# Patient Record
Sex: Male | Born: 1935 | Race: White | Hispanic: No | Marital: Married | Smoking: Never smoker
Health system: Southern US, Community
[De-identification: ages and names within clinical notes are randomized; demographics above are authoritative.]

## PROBLEM LIST (undated history)

## (undated) DIAGNOSIS — K602 Anal fissure, unspecified: Secondary | ICD-10-CM

## (undated) DIAGNOSIS — D126 Benign neoplasm of colon, unspecified: Secondary | ICD-10-CM

## (undated) DIAGNOSIS — N189 Chronic kidney disease, unspecified: Secondary | ICD-10-CM

## (undated) DIAGNOSIS — E785 Hyperlipidemia, unspecified: Secondary | ICD-10-CM

## (undated) DIAGNOSIS — K5790 Diverticulosis of intestine, part unspecified, without perforation or abscess without bleeding: Secondary | ICD-10-CM

## (undated) DIAGNOSIS — I10 Essential (primary) hypertension: Secondary | ICD-10-CM

## (undated) HISTORY — DX: Benign neoplasm of colon, unspecified: D12.6

## (undated) HISTORY — PX: ANAL FISSURE REPAIR: SHX2312

## (undated) HISTORY — DX: Essential (primary) hypertension: I10

## (undated) HISTORY — DX: Hyperlipidemia, unspecified: E78.5

## (undated) HISTORY — DX: Anal fissure, unspecified: K60.2

## (undated) HISTORY — PX: TONSILLECTOMY: SUR1361

## (undated) HISTORY — DX: Diverticulosis of intestine, part unspecified, without perforation or abscess without bleeding: K57.90

## (undated) HISTORY — PX: INGUINAL HERNIA REPAIR: SUR1180

## (undated) HISTORY — DX: Chronic kidney disease, unspecified: N18.9

---

## 2005-03-27 HISTORY — PX: TRANSTHORACIC ECHOCARDIOGRAM: SHX275

## 2006-05-18 ENCOUNTER — Ambulatory Visit (HOSPITAL_COMMUNITY): Admission: RE | Admit: 2006-05-18 | Discharge: 2006-05-18 | Payer: Self-pay | Admitting: General Surgery

## 2009-02-23 HISTORY — PX: COLONOSCOPY: SHX174

## 2010-09-14 ENCOUNTER — Encounter: Payer: Self-pay | Admitting: Cardiovascular Disease

## 2010-09-16 ENCOUNTER — Encounter: Payer: Self-pay | Admitting: Cardiovascular Disease

## 2010-09-16 ENCOUNTER — Ambulatory Visit (INDEPENDENT_AMBULATORY_CARE_PROVIDER_SITE_OTHER): Payer: Medicare Other | Admitting: Cardiovascular Disease

## 2010-09-16 DIAGNOSIS — R079 Chest pain, unspecified: Secondary | ICD-10-CM | POA: Insufficient documentation

## 2010-09-16 DIAGNOSIS — E785 Hyperlipidemia, unspecified: Secondary | ICD-10-CM | POA: Insufficient documentation

## 2010-09-16 NOTE — Progress Notes (Signed)
Justin Shaw Date of Birth  Dec 23, 1934 [facility] [address]     [address] [place], [place]  [address] [phone]  Fax  [phone]  History of Present Illness:  76 yo gentleman with onset of CP.  No related to any activity.  Works out every day without any problems.no syncope, no dyspnea.  The episode lasted for a few minutes. Was recurrent for 2-3 weeks.  His pains have resolved and now he is not having any pain in several weeks. He saw the nurse practitioner at [facility] and was sent up here for a second opinion.   Current Outpatient Prescriptions  Medication Sig Dispense Refill  . aspirin 81 MG tablet Take 81 mg by mouth daily.        . Glucosamine 500 MG TABS Take by mouth.        . Multiple Vitamin (MULTIVITAMIN) capsule Take 1 capsule by mouth daily.        . rosuvastatin (CRESTOR) 5 MG tablet Take 5 mg by mouth daily.           Allergies  Allergen Reactions  . Hydrocodone     Past Medical History  Diagnosis Date  . Chest pain   . Hypertension   . Dyslipidemia     Past Surgical History  Procedure Date  . Colonoscopy 02/23/2009  . Transthoracic echocardiogram 2007    NORMAL LV FUNCTION, AND MILD TRACE MITRAL AND TRICUSPID REGURGITATION  . Inguinal hernia repair     History  Smoking status  . Never Smoker   Smokeless tobacco  . Never Used    History  Alcohol Use No    Family History  Problem Relation Age of Onset  . Hypertension Mother     Reviw of Systems:  Reviewed in the HPI.  All other systems are negative.  Physical Exam: BP 140/82  Pulse 80  Wt 161 lb (73.029 kg) The patient is alert and oriented x 3.  The mood and affect are normal.   Skin: warm and dry.  Color is normal.    HEENT:   the sclera are nonicteric.  The mucous membranes are moist.  The carotids are 2+ without bruits.  There is no thyromegaly.  There is no JVD.    Lungs: clear.  The chest wall is non  tender.    Heart: regular rate with a normal S1 and S2.  There Is a soft 1-2/6 systolic murmur.. The PMI is not displaced.     Abdomin: good bowel sounds.  There is no guarding or rebound.  There is no hepatosplenomegaly or tenderness.  There are no masses.   Extremities:  no clubbing, cyanosis, or edema.  The legs are without rashes.  The distal pulses are intact.   Neuro:  Cranial nerves II - XII are intact.  Motor and sensory functions are intact.    The gait is normal.  ECG: From [facility] reveals normal sinus rhythm. He has no ST or T wave changes.  Assessment / Plan:

## 2010-09-16 NOTE — Assessment & Plan Note (Signed)
Justin Shaw chest pain sounds atypical. I suspect that it may be musculoskeletal pain. He has been exercising for the past several weeks without any episodes of chest pain. At this point I think that his pretest probability is fairly low. He has some hypercholesterolemia but has been well controlled. He does not smoke and he does not have any family history.  We discussed doing a stress echocardiogram but he would like to  wait and see if the pains occurs again. I would tend to agree that these pains were probably atypical and we can continue to follow him clinically. I've given him my business card and he will call me if he has any recurrent episodes of chest pain.I'll see him again in 3 months.

## 2010-12-14 ENCOUNTER — Encounter: Payer: Self-pay | Admitting: Gastroenterology

## 2010-12-22 ENCOUNTER — Ambulatory Visit (INDEPENDENT_AMBULATORY_CARE_PROVIDER_SITE_OTHER): Payer: Medicare Other | Admitting: Cardiovascular Disease

## 2010-12-22 ENCOUNTER — Encounter: Payer: Self-pay | Admitting: Cardiovascular Disease

## 2010-12-22 DIAGNOSIS — R079 Chest pain, unspecified: Secondary | ICD-10-CM

## 2010-12-22 NOTE — Progress Notes (Signed)
Justin Shaw Date of Birth  12-15-1934 [facility] [address]    [address] [place], [place]  [address] [phone]  Fax  [phone]  History of Present Illness:  76 year old gentleman with a history of chest pain. I saw him back in June.  His chest pain symptoms have completely resolved. We did not do a stress test because of his overall low pretest probability. He continues to exercise at [place], regular basis and has not had any episodes of chest pain.  He has noticed some leg fatigue and weakness.  He thinks that it might have been due to the Crestor. He stop the Crestor and is leg weakness and pain has improved.  Current Outpatient Prescriptions on File Prior to Visit  Medication Sig Dispense Refill  . aspirin 81 MG tablet Take 81 mg by mouth daily.        . Glucosamine 500 MG TABS Take by mouth.        . Multiple Vitamin (MULTIVITAMIN) capsule Take 1 capsule by mouth daily.          Allergies  Allergen Reactions  . Hydrocodone     Past Medical History  Diagnosis Date  . Chest pain   . Hypertension   . Dyslipidemia     Past Surgical History  Procedure Date  . Colonoscopy 02/23/2009  . Transthoracic echocardiogram 2007    NORMAL LV FUNCTION, AND MILD TRACE MITRAL AND TRICUSPID REGURGITATION  . Inguinal hernia repair     History  Smoking status  . Never Smoker   Smokeless tobacco  . Never Used    History  Alcohol Use No    Family History  Problem Relation Age of Onset  . Hypertension Mother     Reviw of Systems:  Reviewed in the HPI.  All other systems are negative.  Physical Exam: BP 150/104  Pulse 86  Ht 5\' 10"  (1.778 m)  Wt 161 lb 3.2 oz (73.12 kg)  BMI 23.13 kg/m2 The patient is alert and oriented x 3.  The mood and affect are normal.   Skin: warm and dry.  Color is normal.    HEENT:   the sclera are nonicteric.  The mucous membranes are moist.  The carotids are 2+ without bruits.  There is no thyromegaly.  There is no JVD.     Lungs: clear.  The chest wall is non tender.    Heart: regular rate with a normal S1 and S2.  There are no murmurs, gallops, or rubs. The PMI is not displaced.     Abdomen: good bowel sounds.  There is no guarding or rebound.  There is no hepatosplenomegaly or tenderness.  There are no masses.   Extremities:  no clubbing, cyanosis, or edema.  The legs are without rashes.  The distal pulses are intact.   Neuro:  Cranial nerves II - XII are intact.  Motor and sensory functions are intact.    The gait is normal.  ECG:  Assessment / Plan:

## 2010-12-22 NOTE — Assessment & Plan Note (Signed)
His chest pains have all resolved. He has stopped taking Crestor and he overall feels quite a bit better.  I'll see him again on as-needed basis.

## 2010-12-22 NOTE — Patient Instructions (Signed)
Continue with a good workout program. The back and see [place] if they have any problems.

## 2010-12-28 ENCOUNTER — Encounter: Payer: Self-pay | Admitting: Gastroenterology

## 2011-01-17 ENCOUNTER — Ambulatory Visit (AMBULATORY_SURGERY_CENTER): Payer: Medicare Other

## 2011-01-17 ENCOUNTER — Encounter: Payer: Self-pay | Admitting: Gastroenterology

## 2011-01-17 VITALS — Ht 70.0 in | Wt 164.4 lb

## 2011-01-17 DIAGNOSIS — Z1211 Encounter for screening for malignant neoplasm of colon: Secondary | ICD-10-CM

## 2011-01-17 MED ORDER — PEG-KCL-NACL-NASULF-NA ASC-C 100 G PO SOLR: 1.0000 | Freq: Once | ORAL | Status: AC

## 2011-01-25 ENCOUNTER — Ambulatory Visit (AMBULATORY_SURGERY_CENTER): Payer: Medicare Other | Admitting: Gastroenterology

## 2011-01-25 ENCOUNTER — Encounter: Payer: Self-pay | Admitting: Gastroenterology

## 2011-01-25 VITALS — BP 148/91 | HR 78 | Temp 97.3°F | Resp 18 | Ht 70.0 in | Wt 164.0 lb

## 2011-01-25 DIAGNOSIS — D126 Benign neoplasm of colon, unspecified: Secondary | ICD-10-CM

## 2011-01-25 DIAGNOSIS — K5730 Diverticulosis of large intestine without perforation or abscess without bleeding: Secondary | ICD-10-CM | POA: Insufficient documentation

## 2011-01-25 DIAGNOSIS — Z1211 Encounter for screening for malignant neoplasm of colon: Secondary | ICD-10-CM

## 2011-01-25 MED ORDER — SODIUM CHLORIDE 0.9 % IV SOLN: 500.0000 mL | INTRAVENOUS | Status: DC

## 2011-01-25 NOTE — Patient Instructions (Signed)
Please refer to the blue and neon green sheets for instructions regarding diet and activity for the rest of today.  Handouts on polyps and diverticulosis given. Resume previous medications. 

## 2011-01-26 ENCOUNTER — Telehealth: Payer: Self-pay

## 2011-01-26 NOTE — Telephone Encounter (Signed)

## 2011-01-31 ENCOUNTER — Encounter: Payer: Self-pay | Admitting: Gastroenterology

## 2015-01-15 ENCOUNTER — Encounter: Payer: Self-pay | Admitting: Gastroenterology

## 2015-01-25 ENCOUNTER — Telehealth: Payer: Self-pay | Admitting: Emergency Medicine

## 2015-01-25 NOTE — Telephone Encounter (Deleted)
Call from wife requested prednisone additional prescription since patient started 10 mg po over regular dose on Saturday;

## 2015-11-03 ENCOUNTER — Encounter: Payer: Self-pay | Admitting: *Deleted

## 2015-11-15 ENCOUNTER — Encounter: Payer: Self-pay | Admitting: Gastroenterology

## 2015-12-16 ENCOUNTER — Encounter: Payer: Self-pay | Admitting: Podiatry

## 2015-12-16 ENCOUNTER — Ambulatory Visit (INDEPENDENT_AMBULATORY_CARE_PROVIDER_SITE_OTHER): Payer: Medicare Other | Admitting: Podiatry

## 2015-12-16 VITALS — Ht 70.0 in | Wt 158.0 lb

## 2015-12-16 DIAGNOSIS — B351 Tinea unguium: Secondary | ICD-10-CM

## 2015-12-16 DIAGNOSIS — M79676 Pain in unspecified toe(s): Secondary | ICD-10-CM

## 2015-12-16 DIAGNOSIS — L609 Nail disorder, unspecified: Secondary | ICD-10-CM | POA: Diagnosis not present

## 2015-12-16 DIAGNOSIS — L608 Other nail disorders: Secondary | ICD-10-CM

## 2015-12-16 DIAGNOSIS — M79609 Pain in unspecified limb: Principal | ICD-10-CM

## 2015-12-16 DIAGNOSIS — L603 Nail dystrophy: Secondary | ICD-10-CM

## 2015-12-16 NOTE — Progress Notes (Signed)
   Subjective:    Patient ID: Justin Shaw, male    DOB: [birth year], 81 y.o.   MRN: [id number]  HPI    Review of Systems  All other systems reviewed and are negative.      Objective:   Physical Exam        Assessment & Plan:

## 2015-12-19 NOTE — Progress Notes (Signed)
Patient ID: Justin Shaw, male   DOB: Feb 05, 1935, 81 y.o.   MRN: [id number] SUBJECTIVE Patient  presents to office today complaining of elongated, thickened nails. Pain while ambulating in shoes. Patient is unable to trim their own nails.   OBJECTIVE General Patient is awake, alert, and oriented x 3 and in no acute distress. Derm Skin is dry and supple bilateral. Negative open lesions or macerations. Remaining integument unremarkable. Nails are tender, long, thickened and dystrophic with subungual debris, consistent with onychomycosis, bilateral great toes. No signs of infection noted. Vasc  DP and PT pedal pulses palpable bilaterally. Temperature gradient within normal limits.  Neuro Epicritic and protective threshold sensation diminished bilaterally.  Musculoskeletal Exam No symptomatic pedal deformities noted bilateral. Muscular strength within normal limits.  ASSESSMENT 1. Onychodystrophic nails bilateral great toes with hyperkeratosis of nails.  2. Onychomycosis of nail due to dermatophyte bilateral 3. Pain in foot bilateral  PLAN OF CARE 1. Patient evaluated today.  2. Instructed to maintain good pedal hygiene and foot care.  3. Mechanical debridement of bilateral great toenails performed using a nail nipper. Filed with dremel without incident.  4. Return to clinic when necessary   Edrick Kins, DPM

## 2016-08-03 ENCOUNTER — Other Ambulatory Visit: Payer: Self-pay | Admitting: Internal Medicine

## 2016-08-03 DIAGNOSIS — N183 Chronic kidney disease, stage 3 unspecified: Secondary | ICD-10-CM

## 2016-08-09 ENCOUNTER — Ambulatory Visit
Admission: RE | Admit: 2016-08-09 | Discharge: 2016-08-09 | Disposition: A | Discharge status: Home / Self Care | Payer: Medicare Other | Source: Ambulatory Visit | Attending: Internal Medicine | Admitting: Internal Medicine

## 2016-08-09 DIAGNOSIS — N183 Chronic kidney disease, stage 3 unspecified: Secondary | ICD-10-CM

## 2016-09-08 ENCOUNTER — Encounter (INDEPENDENT_AMBULATORY_CARE_PROVIDER_SITE_OTHER): Payer: Self-pay

## 2016-09-08 ENCOUNTER — Encounter: Payer: Self-pay | Admitting: Gastroenterology

## 2016-09-08 ENCOUNTER — Ambulatory Visit (INDEPENDENT_AMBULATORY_CARE_PROVIDER_SITE_OTHER): Payer: Medicare Other | Admitting: Gastroenterology

## 2016-09-08 VITALS — BP 134/84 | HR 76 | Ht 68.11 in | Wt 156.1 lb

## 2016-09-08 DIAGNOSIS — Z86010 Personal history of colonic polyps: Secondary | ICD-10-CM | POA: Diagnosis not present

## 2016-09-08 DIAGNOSIS — R54 Age-related physical debility: Secondary | ICD-10-CM | POA: Diagnosis not present

## 2016-09-08 MED ORDER — NA SULFATE-K SULFATE-MG SULF 17.5-3.13-1.6 GM/177ML PO SOLN: 1.0000 | Freq: Once | ORAL | 0 refills | Status: AC

## 2016-09-08 NOTE — Progress Notes (Signed)
HPI :  82 y/o male with a history of HTN, HLD, diverticulosis, here to discuss having a colonoscopy given his history of polyps and age.  Last colonoscopy in 2012 with one diminutive adenoma removed. No bowel changes. No blood in the stools. He thinks he has lost some weight, 10 lbs over the past year or so. Eating well. He is very active. He works 7 days per week, he's a Software engineer.   He has no cardiopulmonary issues. He does 30 pushups frequently and walks for exercise. No significant changes to his medical history in recent years.     Colonoscopy 01/25/2011 - diverticulosis, diminutive sigmoid polyp - adenoma, rectal nodule, hypertrophied anal papillae Colonoscopy 10/17/2000 - diverticulosis, hypertrophied anal papillae, hemorrhoids   Past Medical History:  Diagnosis Date  . Anal fissure   . Chest pain   . Diverticulosis   . Dyslipidemia   . Hypertension      Past Surgical History:  Procedure Laterality Date  . ANAL FISSURE REPAIR    . COLONOSCOPY  02/23/2009  . INGUINAL HERNIA REPAIR    . TONSILLECTOMY    . TRANSTHORACIC ECHOCARDIOGRAM  2007   NORMAL LV FUNCTION, AND MILD TRACE MITRAL AND TRICUSPID REGURGITATION   Family History  Problem Relation Age of Onset  . Hypertension Mother   . Diabetes Mother   . Stroke Mother   . Colon cancer Neg Hx    Social History  Substance Use Topics  . Smoking status: Never Smoker  . Smokeless tobacco: Never Used  . Alcohol use Yes     Comment: occasional wine   Current Outpatient Prescriptions  Medication Sig Dispense Refill  . aspirin 81 MG tablet Take 81 mg by mouth daily.      . B Complex Vitamins (VITAMIN B COMPLEX PO) Take 1 tablet by mouth daily.     . benazepril (LOTENSIN) 10 MG tablet Take 10 mg by mouth daily.     . Cholecalciferol (VITAMIN D3) 5000 units CAPS Take 1 capsule by mouth daily.    . Coenzyme Q10 (COQ10) 200 MG CAPS Take 1 tablet by mouth daily.     . fluticasone (FLONASE) 50 MCG/ACT nasal spray Place 1  spray into both nostrils daily.    . Glucosamine 500 MG TABS Take 1 tablet by mouth daily.     Marland Kitchen ibandronate (BONIVA) 150 MG tablet Take 150 mg by mouth every 30 (thirty) days. Take in the morning with a full glass of water, on an empty stomach, and do not take anything else by mouth or lie down for the next 30 min.    Marland Kitchen ibuprofen (ADVIL,MOTRIN) 400 MG tablet Take 400 mg by mouth every 6 (six) hours as needed.      Marland Kitchen LIVALO 4 MG TABS Take 1 tablet by mouth daily.      No current facility-administered medications for this visit.    Allergies  Allergen Reactions  . Crestor [Rosuvastatin Calcium] Other (See Comments)    Chest pain and leg pain  . Hydrocodone Nausea And Vomiting  . Oxycodone-Acetaminophen Nausea And Vomiting     Review of Systems: All systems reviewed and negative except where noted in HPI.   No results found for: WBC, HGB, HCT, MCV, PLT  No results found for: CREATININE, BUN, NA, K, CL, CO2    Physical Exam: BP 134/84 (BP Location: Left Arm, Patient Position: Sitting, Cuff Size: Normal)   Pulse 76   Ht 5' 8.11" (1.73 m) Comment: height measured without  shoes  Wt 156 lb 2 oz (70.8 kg)   BMI 23.66 kg/m  Constitutional: Pleasant,well-developed, male in no acute distress. HEENT: Normocephalic and atraumatic. Conjunctivae are normal. No scleral icterus. Neck supple.  Cardiovascular: Normal rate, regular rhythm.  Pulmonary/chest: Effort normal and breath sounds normal. No wheezing, rales or rhonchi. Abdominal: Soft, nondistended, nontender. there are no masses palpable. No hepatomegaly. Extremities: no edema Lymphadenopathy: No cervical adenopathy noted. Neurological: Alert and oriented to person place and time. Skin: Skin is warm and dry. No rashes noted. Psychiatric: Normal mood and affect. Behavior is normal.   ASSESSMENT AND PLAN: 82 year old male, relatively healthy as outlined above, here to discuss potential surveillance colonoscopy given his history of  one small adenoma that was removed 5 years ago and his age. He is due for surveillance colonoscopy should he wish to have further surveillance. I discussed the risks and benefits of colonoscopy and anesthesia with him. He is in good health for his age and think benefits of colonoscopy would outweigh risks at this point. We discussed this for a bit and at which age to stop surveillance colonoscopy. Following discussion of this he wished to proceed with one more colonoscopy, further recommendations pending the result.  Albion Cellar, MD [facility] Pager [phone]  CC: Crist Infante, MD

## 2016-09-08 NOTE — Patient Instructions (Signed)
If you are age 80 or older, your body mass index should be between 23-30. Your Body mass index is 23.66 kg/m. If this is out of the aforementioned range listed, please consider follow up with your Primary Care Provider.  If you are age 69 or younger, your body mass index should be between 19-25. Your Body mass index is 23.66 kg/m. If this is out of the aformentioned range listed, please consider follow up with your Primary Care Provider.   We have sent the following medications to your pharmacy for you to pick up at your convenience:  [place] have been scheduled for a colonoscopy. Please follow written instructions given to you at your visit today.  Please pick up your prep supplies at the pharmacy within the next 1-3 days. If you use inhalers (even only as needed), please bring them with you on the day of your procedure. Your physician has requested that you go to www.startemmi.com and enter the access code given to you at your visit today. This web site gives a general overview about your procedure. However, you should still follow specific instructions given to you by our office regarding your preparation for the procedure.  Thank you.

## 2016-09-11 ENCOUNTER — Encounter: Payer: Self-pay | Admitting: Gastroenterology

## 2016-09-22 ENCOUNTER — Encounter: Payer: Self-pay | Admitting: Gastroenterology

## 2016-09-22 ENCOUNTER — Ambulatory Visit (AMBULATORY_SURGERY_CENTER): Payer: Medicare Other | Admitting: Gastroenterology

## 2016-09-22 VITALS — BP 143/79 | HR 86 | Temp 99.1°F | Resp 12 | Ht 68.0 in | Wt 156.0 lb

## 2016-09-22 DIAGNOSIS — D120 Benign neoplasm of cecum: Secondary | ICD-10-CM

## 2016-09-22 DIAGNOSIS — Z86010 Personal history of colonic polyps: Secondary | ICD-10-CM | POA: Diagnosis not present

## 2016-09-22 DIAGNOSIS — D122 Benign neoplasm of ascending colon: Secondary | ICD-10-CM

## 2016-09-22 DIAGNOSIS — D125 Benign neoplasm of sigmoid colon: Secondary | ICD-10-CM | POA: Diagnosis not present

## 2016-09-22 DIAGNOSIS — K621 Rectal polyp: Secondary | ICD-10-CM | POA: Diagnosis not present

## 2016-09-22 DIAGNOSIS — D129 Benign neoplasm of anus and anal canal: Secondary | ICD-10-CM

## 2016-09-22 MED ORDER — SODIUM CHLORIDE 0.9 % IV SOLN: 500.0000 mL | INTRAVENOUS | Status: DC

## 2016-09-22 NOTE — Patient Instructions (Signed)
Handout given on polyps  YOU HAD AN ENDOSCOPIC PROCEDURE TODAY: Refer to the procedure report and other information in the discharge instructions given to you for any specific questions about what was found during the examination. If this information does not answer your questions, please call [place] office at [phone] to clarify.   YOU SHOULD EXPECT: Some feelings of bloating in the abdomen. Passage of more gas than usual. Walking can help get rid of the air that was put into your GI tract during the procedure and reduce the bloating. If you had a lower endoscopy (such as a colonoscopy or flexible sigmoidoscopy) you may notice spotting of blood in your stool or on the toilet paper. Some abdominal soreness may be present for a day or two, also.  DIET: Your first meal following the procedure should be a light meal and then it is ok to progress to your normal diet. A half-sandwich or bowl of soup is an example of a good first meal. Heavy or fried foods are harder to digest and may make you feel nauseous or bloated. Drink plenty of fluids but you should avoid alcoholic beverages for 24 hours. If you had a esophageal dilation, please see attached instructions for diet.    ACTIVITY: Your care partner should take you home directly after the procedure. You should plan to take it easy, moving slowly for the rest of the day. You can resume normal activity the day after the procedure however YOU SHOULD NOT DRIVE, use power tools, machinery or perform tasks that involve climbing or major physical exertion for 24 hours (because of the sedation medicines used during the test).   SYMPTOMS TO REPORT IMMEDIATELY: A gastroenterologist can be reached at any hour. Please call [phone]  for any of the following symptoms:  Following lower endoscopy (colonoscopy, flexible sigmoidoscopy) Excessive amounts of blood in the stool  Significant tenderness, worsening of abdominal pains  Swelling of the abdomen that is  new, acute  Fever of 100 or higher    FOLLOW UP:  If any biopsies were taken you will be contacted by phone or by letter within the next 1-3 weeks. Call [phone]  if you have not heard about the biopsies in 3 weeks.  Please also call with any specific questions about appointments or follow up tests.  

## 2016-09-22 NOTE — Progress Notes (Signed)
Called to room to assist during endoscopic procedure.  Patient ID and intended procedure confirmed with present staff. Received instructions for my participation in the procedure from the performing physician.  

## 2016-09-22 NOTE — Progress Notes (Signed)
Pt's states no medical or surgical changes since previsit or office visit. 

## 2016-09-22 NOTE — Op Note (Signed)
[place] Patient Name: Justin Shaw Procedure Date: 09/22/2016 3:34 PM MRN: [id number] Endoscopist: Remo Lipps P. Armbruster MD, MD Age: 82 Referring MD:  Date of Birth: Jul 02, 1934 Gender: Male Account #: [id number] Procedure:                Colonoscopy Indications:              Surveillance: Personal history of adenomatous                            polyps on last colonoscopy 5 years ago Medicines:                Monitored Anesthesia Care Procedure:                Pre-Anesthesia Assessment:                           - Prior to the procedure, a History and Physical                            was performed, and patient medications and                            allergies were reviewed. The patient's tolerance of                            previous anesthesia was also reviewed. The risks                            and benefits of the procedure and the sedation                            options and risks were discussed with the patient.                            All questions were answered, and informed consent                            was obtained. Prior Anticoagulants: The patient has                            taken no previous anticoagulant or antiplatelet                            agents. ASA Grade Assessment: II - A patient with                            mild systemic disease. After reviewing the risks                            and benefits, the patient was deemed in                            satisfactory condition to undergo the procedure.  After obtaining informed consent, the colonoscope                            was passed under direct vision. Throughout the                            procedure, the patient's blood pressure, pulse, and                            oxygen saturations were monitored continuously. The                            Colonoscope was introduced through the anus and                            advanced to the the  cecum, identified by                            appendiceal orifice and ileocecal valve. The                            colonoscopy was performed without difficulty. The                            patient tolerated the procedure well. The quality                            of the bowel preparation was good. The ileocecal                            valve, appendiceal orifice, and rectum were                            photographed. Scope In: 3:42:56 PM Scope Out: 4:03:17 PM Total Procedure Duration: 0 hours 20 minutes 21 seconds  Findings:                 The perianal and digital rectal examinations were                            normal.                           A 5 mm polyp was found in the cecum. The polyp was                            flat. The polyp was removed with a cold snare.                            Resection and retrieval were complete.                           A 3 mm polyp was found in the cecum. The polyp was  sessile. The polyp was removed with a cold snare.                            Resection and retrieval were complete.                           A 4 mm polyp was found in the ascending colon. The                            polyp was sessile. The polyp was removed with a                            cold snare. Resection and retrieval were complete.                           A 5 mm polyp was found in the sigmoid colon. The                            polyp was sessile. The polyp was removed with a                            cold snare. Resection and retrieval were complete.                           Scattered medium-mouthed diverticula were found in                            the left colon and right colon.                           A benign appearing polypoid lesion was found in the                            distal rectum. The lesion was semi-pedunculated.                            Biopsies were taken with a cold forceps for                             histology.                           Internal hemorrhoids were found during retroflexion.                           The exam was otherwise without abnormality. Complications:            No immediate complications. Estimated blood loss:                            Minimal. Estimated Blood Loss:     Estimated blood loss was minimal. Impression:               - One 5 mm polyp in the cecum, removed with a  cold                            snare. Resected and retrieved.                           - One 3 mm polyp in the cecum, removed with a cold                            snare. Resected and retrieved.                           - One 4 mm polyp in the ascending colon, removed                            with a cold snare. Resected and retrieved.                           - One 5 mm polyp in the sigmoid colon, removed with                            a cold snare. Resected and retrieved.                           - Diverticulosis in the left colon and in the right                            colon.                           - Benign appearing polypoid lesion in the distal                            rectum. Biopsied.                           - Internal hemorrhoids.                           - The examination was otherwise normal. Recommendation:           - Patient has a contact number available for                            emergencies. The signs and symptoms of potential                            delayed complications were discussed with the                            patient. Return to normal activities tomorrow.                            Written discharge instructions were provided to the  patient.                           - Resume previous diet.                           - Continue present medications.                           - Await pathology results.                           - Repeat colonoscopy is recommended for                             surveillance. The colonoscopy date will be                            determined after pathology results from today's                            exam become available for review.                           - No ibuprofen, naproxen, or other non-steroidal                            anti-inflammatory drugs for 2 weeks after polyp                            removal. Remo Lipps P. Armbruster MD, MD 09/22/2016 4:09:14 PM This report has been signed electronically.

## 2016-09-22 NOTE — Progress Notes (Signed)
Alert and oriented x3, pleased with MAC, report to RN

## 2016-09-25 ENCOUNTER — Telehealth: Payer: Self-pay

## 2016-09-25 NOTE — Telephone Encounter (Signed)
  Follow up Call-  Call back number 09/22/2016  Post procedure Call Back phone  # [phone] to talk with wife  Permission to leave phone message Yes  Some recent data might be hidden     Patient questions:  Do you have a fever, pain , or abdominal swelling? No. Pain Score  0 *  Have you tolerated food without any problems? Yes.    Have you been able to return to your normal activities? Yes.    Do you have any questions about your discharge instructions: Diet   No. Medications  No. Follow up visit  No.  Do you have questions or concerns about your Care? Patient has had some diarrhea since the procedure but is resolving now.  Actions: * If pain score is 4 or above: No action needed, pain <4.

## 2016-09-29 ENCOUNTER — Encounter: Payer: Self-pay | Admitting: Gastroenterology

## 2016-12-29 ENCOUNTER — Other Ambulatory Visit: Payer: Self-pay | Admitting: Internal Medicine

## 2016-12-29 DIAGNOSIS — N281 Cyst of kidney, acquired: Secondary | ICD-10-CM

## 2017-01-03 ENCOUNTER — Ambulatory Visit
Admission: RE | Admit: 2017-01-03 | Discharge: 2017-01-03 | Disposition: A | Discharge status: Home / Self Care | Payer: Medicare Other | Source: Ambulatory Visit | Attending: Internal Medicine | Admitting: Internal Medicine

## 2017-01-03 DIAGNOSIS — N281 Cyst of kidney, acquired: Secondary | ICD-10-CM

## 2017-01-04 ENCOUNTER — Other Ambulatory Visit: Payer: Medicare Other

## 2019-01-16 ENCOUNTER — Other Ambulatory Visit: Payer: Self-pay

## 2019-01-16 DIAGNOSIS — Z20822 Contact with and (suspected) exposure to covid-19: Secondary | ICD-10-CM

## 2019-01-18 LAB — NOVEL CORONAVIRUS, NAA: SARS-CoV-2, NAA: NOT DETECTED

## 2019-04-05 ENCOUNTER — Ambulatory Visit: Payer: Medicare Other | Attending: Internal Medicine

## 2019-04-05 DIAGNOSIS — Z23 Encounter for immunization: Secondary | ICD-10-CM | POA: Insufficient documentation

## 2019-04-05 NOTE — Progress Notes (Signed)
   Covid-19 Vaccination Clinic  Name:  Justin Shaw    MRN: [id number] DOB: October 05, 1934  04/05/2019  [name] was observed post Covid-19 immunization for 15 minutes without incidence. He was provided with Vaccine Information Sheet and instruction to access the V-Safe system.   [name] was instructed to call 911 with any severe reactions post vaccine: Marland Kitchen Difficulty breathing  . Swelling of your face and throat  . A fast heartbeat  . A bad rash all over your body  . Dizziness and weakness    Immunizations Administered    Name Date Dose VIS Date Route   Pfizer COVID-19 Vaccine 04/05/2019  1:48 PM 0.3 mL 03/07/2019 Intramuscular   Manufacturer: [employer], [employer]   Lot: [id number]   [place]: [id number]

## 2019-04-23 ENCOUNTER — Ambulatory Visit: Payer: Medicare Other

## 2019-04-26 ENCOUNTER — Ambulatory Visit: Payer: Medicare Other | Attending: Internal Medicine

## 2019-04-26 DIAGNOSIS — Z23 Encounter for immunization: Secondary | ICD-10-CM | POA: Insufficient documentation

## 2019-04-26 NOTE — Progress Notes (Signed)
   Covid-19 Vaccination Clinic  Name:  Justin Shaw    MRN: [id number] DOB: [birth year]  04/26/2019  Mr. Justin Shaw was observed post Covid-19 immunization for 15 minutes without incidence. He was provided with Vaccine Information Sheet and instruction to access the V-Safe system.   Mr. Justin Shaw was instructed to call 911 with any severe reactions post vaccine: Marland Kitchen Difficulty breathing  . Swelling of your face and throat  . A fast heartbeat  . A bad rash all over your body  . Dizziness and weakness    Immunizations Administered    Name Date Dose VIS Date Route   Pfizer COVID-19 Vaccine 04/26/2019  1:33 PM 0.3 mL 03/07/2019 Intramuscular   Manufacturer: [place]   Lot: [id number]   [place]: [id number]

## 2019-06-09 ENCOUNTER — Ambulatory Visit: Payer: Medicare Other | Admitting: Internal Medicine

## 2019-06-09 ENCOUNTER — Encounter: Payer: Self-pay | Admitting: Internal Medicine

## 2019-06-09 ENCOUNTER — Other Ambulatory Visit: Payer: Self-pay

## 2019-06-09 VITALS — BP 160/100 | HR 76 | Temp 97.4°F | Ht 70.0 in | Wt 161.4 lb

## 2019-06-09 DIAGNOSIS — I10 Essential (primary) hypertension: Secondary | ICD-10-CM | POA: Diagnosis not present

## 2019-06-09 DIAGNOSIS — M79605 Pain in left leg: Secondary | ICD-10-CM

## 2019-06-09 DIAGNOSIS — E785 Hyperlipidemia, unspecified: Secondary | ICD-10-CM | POA: Diagnosis not present

## 2019-06-09 NOTE — Patient Instructions (Signed)
Medication Instructions:  No changes  *If you need a refill on your cardiac medications before your next appointment, please call your pharmacy*   Lab Work: Not needed   Testing/Procedures: Will be schedule [address] Your physician has requested that you have an ankle brachial index (ABI). During this test an ultrasound and blood pressure cuff are used to evaluate the arteries that supply the arms and legs with blood. Allow thirty minutes for this exam. There are no restrictions or special instructions. And if abnormal - Your physician has requested that you have a lower  extremity arterial duplex. This test is an ultrasound of the arteries in the legs. It looks at arterial blood flow in the legs. Allow one hour for Lower Arterial scans. There are no restrictions or special instructions   Follow-Up: At [facility], you and your health needs are our priority.  As part of our continuing mission to provide you with exceptional heart care, we have created designated Provider Care Teams.  These Care Teams include your primary Cardiologist (physician) and Advanced Practice Providers (APPs -  Physician Assistants and Nurse Practitioners) who all work together to provide you with the care you need, when you need it.  We recommend signing up for the patient portal called "MyChart".  Sign up information is provided on this After Visit Summary.  MyChart is used to connect with patients for Virtual Visits (Telemedicine).  Patients are able to view lab/test results, encounter notes, upcoming appointments, etc.  Non-urgent messages can be sent to your provider as well.   To learn more about what you can do with MyChart, go to [email or web address].    Your next appointment:   1 month(s)  The format for your next appointment:   Virtual Visit   Provider:   Cherlynn Kaiser, MD

## 2019-06-09 NOTE — Progress Notes (Signed)
Cardiology Office Note:    Date:  06/09/2019   ID:  [name], DOB 1934/09/19, MRN [id number]  PCP:  Crist Infante, MD  Cardiologist:  Elouise Munroe, MD  Electrophysiologist:  None   Referring MD: Crist Infante, MD   Chief Complaint: exertional leg pain  History of Present Illness:    Justin Shaw is a 85 y.o. male with a history of HTN, dyslipidemia who presents for evaluation of bilateral leg pain at the request of his wife, who is also my patient.   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 At the end of the day he notes left leg pain, which is responsive to tylenol. He denies significant cramping, but does note symptoms with standing and long periods of exertion. Does have pain with walking during busy days. No known vascular disease or CAD.   Denies current chest pain or SOB. Overall feels he is quite well. Takes benazepril for HTN, livalo for HLD.   Past Medical History:  Diagnosis Date  . Adenomatous colon polyp   . Anal fissure   . Chest pain   . Diverticulosis   . Dyslipidemia   . Hypertension     Past Surgical History:  Procedure Laterality Date  . ANAL FISSURE REPAIR    . COLONOSCOPY  02/23/2009  . INGUINAL HERNIA REPAIR    . TONSILLECTOMY    . TRANSTHORACIC ECHOCARDIOGRAM  2007   NORMAL LV FUNCTION, AND MILD TRACE MITRAL AND TRICUSPID REGURGITATION    Current Medications: Current Meds  Medication Sig  . aspirin 81 MG tablet Take 81 mg by mouth daily.    . benazepril (LOTENSIN) 10 MG tablet Take 10 mg by mouth daily.   . Cholecalciferol (VITAMIN D3) 5000 units CAPS Take 1 capsule by mouth daily.  . Coenzyme Q10 (COQ10) 200 MG CAPS Take 1 tablet by mouth daily.   . fluticasone (FLONASE) 50 MCG/ACT nasal spray Place 1 spray into both nostrils daily.  . Glucosamine 500 MG TABS Take 1 tablet by mouth daily.   Marland Kitchen LIVALO 4 MG TABS Take 1 tablet by mouth daily.   . [DISCONTINUED] B Complex Vitamins (VITAMIN B COMPLEX PO) Take 1  tablet by mouth daily.   . [DISCONTINUED] ibandronate (BONIVA) 150 MG tablet Take 150 mg by mouth every 30 (thirty) days. Take in the morning with a full glass of water, on an empty stomach, and do not take anything else by mouth or lie down for the next 30 min.  . [DISCONTINUED] ibuprofen (ADVIL,MOTRIN) 400 MG tablet Take 400 mg by mouth every 6 (six) hours as needed.     Current Facility-Administered Medications for the 06/09/19 encounter (Office Visit) with Elouise Munroe, MD  Medication  . 0.9 %  sodium chloride infusion     Allergies:   Crestor [rosuvastatin calcium], Hydrocodone, and Oxycodone-acetaminophen   Social History   Socioeconomic History  . Marital status: Married    Spouse name: Not on file  . Number of children: 3  . Years of education: Not on file  . Highest education level: Not on file  Occupational History  . Occupation: pharmacist  Tobacco Use  . Smoking status: Never Smoker  . Smokeless tobacco: Never Used  Substance and Sexual Activity  . Alcohol use: Yes    Comment: occasional wine  . Drug use: No  . Sexual activity: Not on file  Other Topics Concern  . Not on file  Social  History Narrative  . Not on file   Social Determinants of Health   Financial Resource Strain:   . Difficulty of Paying Living Expenses:   Food Insecurity:   . Worried About Charity fundraiser in the Last Year:   . Arboriculturist in the Last Year:   Transportation Needs:   . Film/video editor (Medical):   Marland Kitchen Lack of Transportation (Non-Medical):   Physical Activity:   . Days of Exercise per Week:   . Minutes of Exercise per Session:   Stress:   . Feeling of Stress :   Social Connections:   . Frequency of Communication with Friends and Family:   . Frequency of Social Gatherings with Friends and Family:   . Attends Religious Services:   . Active Member of Clubs or Organizations:   . Attends Archivist Meetings:   Marland Kitchen Marital Status:      Family  History: The patient's family history includes Diabetes in his mother; Hypertension in his mother; Stroke in his mother. There is no history of Colon cancer.  ROS:   Please see the history of present illness.    All other systems reviewed and are negative.  EKGs/Labs/Other Studies Reviewed:    The following studies were reviewed today:  EKG:  NSR, sinus arrhythmia, rate 76   Recent Labs: No results found for requested labs within last 8760 hours.  Recent Lipid Panel No results found for: CHOL, TRIG, HDL, CHOLHDL, VLDL, LDLCALC, LDLDIRECT  Physical Exam:    VS:  BP (!) 160/100   Pulse 76   Temp (!) 97.4 F (36.3 C)   Ht 5\' 10"  (1.778 m)   Wt 161 lb 6.4 oz (73.2 kg)   SpO2 98%   BMI 23.16 kg/m     Wt Readings from Last 5 Encounters:  06/09/19 161 lb 6.4 oz (73.2 kg)  09/22/16 156 lb (70.8 kg)  09/08/16 156 lb 2 oz (70.8 kg)  12/16/15 158 lb (71.7 kg)  01/25/11 164 lb (74.4 kg)     Constitutional: No acute distress Eyes: sclera non-icteric, normal conjunctiva and lids Cardiovascular: regular rhythm, normal rate, no murmurs. S1 and S2 normal. Radial pulses normal bilaterally. No jugular venous distention.  Respiratory: clear to auscultation bilaterally GI : normal bowel sounds, soft and nontender. No distention.   MSK: extremities warm, well perfused. No edema.  NEURO: grossly nonfocal exam, moves all extremities. PSYCH: alert and oriented x 3, normal mood and affect.   ASSESSMENT:    1. Pain of left lower extremity   2. Hyperlipidemia, unspecified hyperlipidemia type   3. Essential hypertension    PLAN:    Pain in LLE - will obtain lower extremity vascular arterial studies to evaluate for PAD. If it is not related to PAD, it may be secondary to radiculopathy vs spinal stenosis, which can be assessed further by primary physician.   HLD - can continue livalo, followed by PCP.  HTN - somewhat elevated today though patient thinks this is spurious. He can check his  blood pressure at home and will continue to follow.   Total time of encounter: 40 minutes total time of encounter, including 20 minutes spent in face-to-face patient care. This time includes coordination of care and counseling regarding above mentioned problem list. Remainder of non-face-to-face time involved reviewing chart documents/testing relevant to the patient encounter and documentation in the medical record. I have independently reviewed documentation from referring provider.   Cherlynn Kaiser, MD [place]  [facility]    Medication Adjustments/Labs and Tests Ordered: Current medicines are reviewed at length with the patient today.  Concerns regarding medicines are outlined above.  Orders Placed This Encounter  Procedures  . EKG 12-Lead   No orders of the defined types were placed in this encounter.   Patient Instructions  Medication Instructions:  No changes  *If you need a refill on your cardiac medications before your next appointment, please call your pharmacy*   Lab Work: Not needed   Testing/Procedures: Will be schedule [address] Your physician has requested that you have an ankle brachial index (ABI). During this test an ultrasound and blood pressure cuff are used to evaluate the arteries that supply the arms and legs with blood. Allow thirty minutes for this exam. There are no restrictions or special instructions. And if abnormal - Your physician has requested that you have a lower  extremity arterial duplex. This test is an ultrasound of the arteries in the legs. It looks at arterial blood flow in the legs. Allow one hour for Lower Arterial scans. There are no restrictions or special instructions   Follow-Up: At [facility], you and your health needs are our priority.  As part of our continuing mission to provide you with exceptional heart care, we have created designated Provider Care Teams.  These Care Teams include your primary  Cardiologist (physician) and Advanced Practice Providers (APPs -  Physician Assistants and Nurse Practitioners) who all work together to provide you with the care you need, when you need it.  We recommend signing up for the patient portal called "MyChart".  Sign up information is provided on this After Visit Summary.  MyChart is used to connect with patients for Virtual Visits (Telemedicine).  Patients are able to view lab/test results, encounter notes, upcoming appointments, etc.  Non-urgent messages can be sent to your provider as well.   To learn more about what you can do with MyChart, go to [email or web address].    Your next appointment:   1 month(s)  The format for your next appointment:   Virtual Visit   Provider:   Cherlynn Kaiser, MD

## 2019-06-10 ENCOUNTER — Other Ambulatory Visit: Payer: Self-pay | Admitting: Internal Medicine

## 2019-06-10 DIAGNOSIS — I739 Peripheral vascular disease, unspecified: Secondary | ICD-10-CM

## 2019-06-17 ENCOUNTER — Encounter (HOSPITAL_COMMUNITY): Payer: Medicare Other

## 2019-06-23 ENCOUNTER — Other Ambulatory Visit: Payer: Self-pay

## 2019-06-23 ENCOUNTER — Ambulatory Visit (HOSPITAL_COMMUNITY)
Admission: RE | Admit: 2019-06-23 | Discharge: 2019-06-23 | Disposition: A | Discharge status: Home / Self Care | Payer: Medicare Other | Source: Ambulatory Visit | Attending: Internal Medicine | Admitting: Internal Medicine

## 2019-06-23 DIAGNOSIS — I739 Peripheral vascular disease, unspecified: Secondary | ICD-10-CM | POA: Insufficient documentation

## 2019-07-15 ENCOUNTER — Telehealth (INDEPENDENT_AMBULATORY_CARE_PROVIDER_SITE_OTHER): Payer: Medicare Other | Admitting: Internal Medicine

## 2019-07-15 VITALS — BP 131/79 | Ht 70.0 in | Wt 158.0 lb

## 2019-07-15 DIAGNOSIS — I10 Essential (primary) hypertension: Secondary | ICD-10-CM | POA: Diagnosis not present

## 2019-07-15 DIAGNOSIS — M79605 Pain in left leg: Secondary | ICD-10-CM | POA: Diagnosis not present

## 2019-07-15 DIAGNOSIS — E785 Hyperlipidemia, unspecified: Secondary | ICD-10-CM

## 2019-07-15 NOTE — Progress Notes (Signed)
Virtual Visit via Telephone Note   This visit type was conducted due to national recommendations for restrictions regarding the COVID-19 Pandemic (e.g. social distancing) in an effort to limit this patient's exposure and mitigate transmission in our community.  Due to his co-morbid illnesses, this patient is at least at moderate risk for complications without adequate follow up.  This format is felt to be most appropriate for this patient at this time.  The patient did not have access to video technology/had technical difficulties with video requiring transitioning to audio format only (telephone).  All issues noted in this document were discussed and addressed.  No physical exam could be performed with this format.  Please refer to the patient's chart for his  consent to telehealth for [facility].   The patient was identified using 2 identifiers.  Date:  07/15/2019   ID:  Justin Shaw, DOB 05-Sep-1934, MRN [id number]  Patient Location: Other:  work Provider Location: Home  PCP:  Crist Infante, MD  Cardiologist:  Elouise Munroe, MD  Electrophysiologist:  None   Evaluation Performed:  Follow-Up Visit  Chief Complaint:  F/u LLE pain  History of Present Illness:    Justin Shaw is a 85 y.o. male with HTN, HLD, and leg pain who presents for follow up.    Continues to have LLE discomfort. Discussed results of LE arterial studies, normal. No significant concern for DVT. Symptoms sound radicular, which patient believes as well. Will discuss with PCP  The patient does not have symptoms concerning for COVID-19 infection (fever, chills, cough, or new shortness of breath).    Past Medical History:  Diagnosis Date  . Adenomatous colon polyp   . Anal fissure   . Chest pain   . Diverticulosis   . Dyslipidemia   . Hypertension    Past Surgical History:  Procedure Laterality Date  . ANAL FISSURE REPAIR    . COLONOSCOPY  02/23/2009  . INGUINAL HERNIA REPAIR    .  TONSILLECTOMY    . TRANSTHORACIC ECHOCARDIOGRAM  2007   NORMAL LV FUNCTION, AND MILD TRACE MITRAL AND TRICUSPID REGURGITATION     Current Meds  Medication Sig  . benazepril (LOTENSIN) 10 MG tablet Take 10 mg by mouth daily.   . Cholecalciferol (VITAMIN D3) 50 MCG (2000 UT) capsule Take 1 capsule by mouth daily.   . Coenzyme Q10 (COQ10) 200 MG CAPS Take 1 tablet by mouth daily.   . fluticasone (FLONASE) 50 MCG/ACT nasal spray Place 1 spray into both nostrils daily.  . Glucosamine 500 MG TABS Take 1 tablet by mouth daily.   Marland Kitchen LIVALO 2 MG TABS Take 1 tablet by mouth daily.    Current Facility-Administered Medications for the 07/15/19 encounter (Telemedicine) with Elouise Munroe, MD  Medication  . 0.9 %  sodium chloride infusion     Allergies:   Crestor [rosuvastatin calcium], Hydrocodone, and Oxycodone-acetaminophen   Social History   Tobacco Use  . Smoking status: Never Smoker  . Smokeless tobacco: Never Used  Substance Use Topics  . Alcohol use: Yes    Comment: occasional wine  . Drug use: No     Family Hx: The patient's family history includes Diabetes in his mother; Hypertension in his mother; Stroke in his mother. There is no history of Colon cancer.  ROS:   Please see the history of present illness.     All other systems reviewed and are negative.   Prior CV studies:   The following studies  were reviewed today:    Labs/Other Tests and Data Reviewed:    EKG:  No ECG reviewed.  Recent Labs: No results found for requested labs within last 8760 hours.   Recent Lipid Panel No results found for: CHOL, TRIG, HDL, CHOLHDL, LDLCALC, LDLDIRECT  Wt Readings from Last 3 Encounters:  07/15/19 158 lb (71.7 kg)  06/09/19 161 lb 6.4 oz (73.2 kg)  09/22/16 156 lb (70.8 kg)     Objective:    Vital Signs:  BP 131/79   Ht 5\' 10"  (1.778 m)   Wt 158 lb (71.7 kg)   BMI 22.67 kg/m    VITAL SIGNS:  reviewed GEN:  no acute distress RESPIRATORY:  normal respiratory  effort, no increased work of breathing NEURO:  alert and oriented x 3, speech normal PSYCH:  normal affect   ASSESSMENT & PLAN:    1. Pain of left lower extremity   2. Hyperlipidemia, unspecified hyperlipidemia type   3. Essential hypertension    Patient will discuss LLE discomfort with PCP. No further cardiovascular testing required at this time.   Continue current cardiovascular medications at this time. Patient would like to follow up PRN, this is reasonable.   COVID-19 Education: The signs and symptoms of COVID-19 were discussed with the patient and how to seek care for testing (follow up with PCP or arrange E-visit).  The importance of social distancing was discussed today.  Time:   Today, I have spent 5 minutes with the patient with telehealth technology discussing the above problems.  10 minutes total encounter time on today's date.   Medication Adjustments/Labs and Tests Ordered: Current medicines are reviewed at length with the patient today.  Concerns regarding medicines are outlined above.   Tests Ordered: No orders of the defined types were placed in this encounter.   Medication Changes: No orders of the defined types were placed in this encounter.   Follow Up:  prn  Signed, Elouise Munroe, MD  07/15/2019 4:40 PM    [facility]

## 2019-07-15 NOTE — Patient Instructions (Signed)
Medication Instructions:  Your Physician recommend you continue on your current medication as directed.    *If you need a refill on your cardiac medications before your next appointment, please call your pharmacy*   Lab Work: None   Testing/Procedures: None  Follow-Up: At [facility], you and your health needs are our priority.  As part of our continuing mission to provide you with exceptional heart care, we have created designated Provider Care Teams.  These Care Teams include your primary Cardiologist (physician) and Advanced Practice Providers (APPs -  Physician Assistants and Nurse Practitioners) who all work together to provide you with the care you need, when you need it.  We recommend signing up for the patient portal called "MyChart".  Sign up information is provided on this After Visit Summary.  MyChart is used to connect with patients for Virtual Visits (Telemedicine).  Patients are able to view lab/test results, encounter notes, upcoming appointments, etc.  Non-urgent messages can be sent to your provider as well.   To learn more about what you can do with MyChart, go to [email or web address].    Your next appointment:   As needed   The format for your next appointment:   Either In Person or Virtual  Provider:   Cherlynn Kaiser, MD

## 2019-11-04 ENCOUNTER — Encounter: Payer: Self-pay | Admitting: Gastroenterology

## 2019-11-17 ENCOUNTER — Other Ambulatory Visit: Payer: Self-pay | Admitting: Internal Medicine

## 2019-11-17 DIAGNOSIS — M8580 Other specified disorders of bone density and structure, unspecified site: Secondary | ICD-10-CM

## 2019-12-08 ENCOUNTER — Other Ambulatory Visit: Payer: Self-pay

## 2019-12-08 ENCOUNTER — Ambulatory Visit
Admission: RE | Admit: 2019-12-08 | Discharge: 2019-12-08 | Disposition: A | Discharge status: Home / Self Care | Payer: Medicare Other | Source: Ambulatory Visit | Attending: Internal Medicine | Admitting: Internal Medicine

## 2019-12-08 DIAGNOSIS — M8580 Other specified disorders of bone density and structure, unspecified site: Secondary | ICD-10-CM

## 2020-01-10 ENCOUNTER — Ambulatory Visit: Payer: Medicare Other | Attending: Internal Medicine

## 2020-01-10 DIAGNOSIS — Z23 Encounter for immunization: Secondary | ICD-10-CM

## 2020-01-10 NOTE — Progress Notes (Signed)
   Covid-19 Vaccination Clinic  Name:  Justin Shaw    MRN: [id number] DOB: [birth year]  01/10/2020  [name] was observed post Covid-19 immunization for 15 minutes without incident. He was provided with Vaccine Information Sheet and instruction to access the V-Safe system.   [name] was instructed to call 911 with any severe reactions post vaccine: Marland Kitchen Difficulty breathing  . Swelling of face and throat  . A fast heartbeat  . A bad rash all over body  . Dizziness and weakness

## 2020-04-19 ENCOUNTER — Ambulatory Visit: Payer: Medicare Other | Admitting: Podiatry

## 2020-04-19 ENCOUNTER — Ambulatory Visit (INDEPENDENT_AMBULATORY_CARE_PROVIDER_SITE_OTHER): Payer: Medicare Other

## 2020-04-19 ENCOUNTER — Other Ambulatory Visit: Payer: Self-pay

## 2020-04-19 DIAGNOSIS — M79675 Pain in left toe(s): Secondary | ICD-10-CM

## 2020-04-19 DIAGNOSIS — L84 Corns and callosities: Secondary | ICD-10-CM

## 2020-04-19 DIAGNOSIS — M722 Plantar fascial fibromatosis: Secondary | ICD-10-CM | POA: Diagnosis not present

## 2020-04-19 DIAGNOSIS — L600 Ingrowing nail: Secondary | ICD-10-CM | POA: Diagnosis not present

## 2020-04-19 NOTE — Patient Instructions (Signed)

## 2020-04-20 ENCOUNTER — Encounter: Payer: Self-pay | Admitting: Podiatry

## 2020-04-20 DIAGNOSIS — M722 Plantar fascial fibromatosis: Secondary | ICD-10-CM | POA: Diagnosis not present

## 2020-04-20 MED ORDER — DEXAMETHASONE SODIUM PHOSPHATE 4 MG/ML IJ SOLN
4.0000 mg | Freq: Once | INTRAMUSCULAR | Status: AC
  Administered 2020-04-20: 4 mg

## 2020-04-20 MED ORDER — TRIAMCINOLONE ACETONIDE 40 MG/ML IJ SUSP
10.0000 mg | Freq: Once | INTRAMUSCULAR | Status: AC
  Administered 2020-04-20: 10 mg

## 2020-04-20 NOTE — Progress Notes (Signed)
  Subjective:  Patient ID: Justin Shaw, male    DOB: [birth year],  MRN: [id number]  Chief Complaint  Patient presents with  . Nail Problem    Left foot possible ingrown and PT stated he has a knot on the side of his foot and he is having heel pain in the right foot     86 y.o. male presents with the above complaint. History confirmed with patient.  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  The pain in the callus at the base of the fifth metatarsal on the left side got better after he changed his shoes.  He has an ingrown left great toenail which the corners examined at the tip.  He also complains of heel pain which is worse when he stands up in the morning.  Is on the right side.  Objective:  Physical Exam: warm, good capillary refill, no trophic changes or ulcerative lesions, normal DP and PT pulses and normal sensory exam. Left Foot: Prominent fifth metatarsal base with overlying hyperkeratosis.  Mild ingrowing distal medial hallux nail border without paronychia Right Foot: point tenderness over the heel pad   No images are attached to the encounter.  Radiographs: X-ray of both feet: no fracture, dislocation, swelling or degenerative changes noted and plantar calcaneal spur Assessment:   1. Plantar fasciitis   2. Callus of foot   3. Ingrowing left great toenail      Plan:  Patient was evaluated and treated and all questions answered.  Discussed with him the callus overlying the fifth metatarsal base which is likely the source of his pain.  We discussed shoe recommendations and changes which he is already noted improvement.  Debrided lesion for him today as a courtesy.  Complex will be an issue for him long-term  He had a mild ingrowing medial hallux nail border on the left side.  Debrided the nail in a slant back fashion to alleviate pressure in the offending border.  He tolerated this well.  Did not need a formal partial nail  avulsion or matricectomy today.  If it becomes a recurrent issue we can consider this.  Discussed the etiology and treatment options for plantar fasciitis including stretching, formal physical therapy, supportive shoegears such as a running shoe or sneaker, pre fabricated orthoses, injection therapy, and oral medications. We also discussed the role of surgical treatment of this for patients who do not improve after exhausting non-surgical treatment options.   -XR reviewed with patient -Educated patient on stretching and icing of the affected limb -Plantar fascial brace dispensed -Injection delivered to the plantar fascia of the right foot.  -He is unable to take oral NSAIDs due to his cardiac history and age  After sterile prep with povidone-iodine solution and alcohol, the right heel was injected with 1 cc 0.5% marcaine plain, 10 mg triamcinolone acetonide, and 4 mg dexamethasone was injected along  the plantar fascia at the insertion on the plantar calcaneus. The patient tolerated the procedure well without complication.   Return in about 1 month (around 05/20/2020).

## 2020-04-26 ENCOUNTER — Other Ambulatory Visit: Payer: Self-pay

## 2020-05-24 ENCOUNTER — Other Ambulatory Visit: Payer: Self-pay

## 2020-05-24 ENCOUNTER — Ambulatory Visit: Payer: Medicare Other | Admitting: Podiatry

## 2020-05-24 ENCOUNTER — Encounter: Payer: Self-pay | Admitting: Podiatry

## 2020-05-24 DIAGNOSIS — M722 Plantar fascial fibromatosis: Secondary | ICD-10-CM | POA: Diagnosis not present

## 2020-05-24 NOTE — Progress Notes (Signed)
  Subjective:  Patient ID: Justin Shaw, male    DOB: [birth year],  MRN: [id number]  Chief Complaint  Patient presents with  . Plantar Fasciitis    Pt stated that he is doing well he stated that the pain is doing a lot better.    86 y.o. male returns with the above complaint. History confirmed with patient.  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  Still has some intermittent pain but overall significantly better.  Objective:  Physical Exam: warm, good capillary refill, no trophic changes or ulcerative lesions, normal DP and PT pulses and normal sensory exam.  Right Foot: Minimal pain over the heel pad today    Radiographs: X-ray of both feet: no fracture, dislocation, swelling or degenerative changes noted and plantar calcaneal spur Assessment:   1. Plantar fasciitis      Plan:  Patient was evaluated and treated and all questions answered.  Doing much better with plantar fasciitis.  I encouraged him to continue wearing the plantar fascial brace as needed as well as doing the stretching exercises until he is pain-free for about 2 weeks at this point I will discharge him from care he should return as needed if he has more pain.   Return if symptoms worsen or fail to improve.

## 2020-07-30 ENCOUNTER — Other Ambulatory Visit: Payer: Self-pay

## 2020-07-30 ENCOUNTER — Other Ambulatory Visit (HOSPITAL_BASED_OUTPATIENT_CLINIC_OR_DEPARTMENT_OTHER): Payer: Self-pay

## 2020-07-30 ENCOUNTER — Ambulatory Visit: Payer: Medicare Other | Attending: Internal Medicine

## 2020-07-30 DIAGNOSIS — Z23 Encounter for immunization: Secondary | ICD-10-CM

## 2020-07-30 MED ORDER — PFIZER-BIONT COVID-19 VAC-TRIS 30 MCG/0.3ML IM SUSP
INTRAMUSCULAR | 0 refills | Status: DC
  Filled 2020-07-30: qty 0.3, 1d supply, fill #0

## 2020-07-30 NOTE — Progress Notes (Signed)
   Covid-19 Vaccination Clinic  Name:  VRISHANK MOSTER    MRN: [id number] DOB: [birth year]  07/30/2020  Mr. Claar was observed post Covid-19 immunization for 15 minutes without incident. He was provided with Vaccine Information Sheet and instruction to access the V-Safe system.   Mr. Bogle was instructed to call 911 with any severe reactions post vaccine: Marland Kitchen Difficulty breathing  . Swelling of face and throat  . A fast heartbeat  . A bad rash all over body  . Dizziness and weakness   Immunizations Administered    Name Date Dose VIS Date Route   PFIZER Comrnaty(Gray TOP) Covid-19 Vaccine 07/30/2020  3:00 PM 0.3 mL 03/04/2020 Intramuscular   Manufacturer: [place]   Lot: [id number]   NDC: [phone]

## 2021-02-03 ENCOUNTER — Other Ambulatory Visit: Payer: Self-pay

## 2021-02-03 ENCOUNTER — Other Ambulatory Visit (HOSPITAL_BASED_OUTPATIENT_CLINIC_OR_DEPARTMENT_OTHER): Payer: Self-pay

## 2021-02-03 ENCOUNTER — Ambulatory Visit: Payer: Medicare Other | Attending: Internal Medicine

## 2021-02-03 DIAGNOSIS — Z23 Encounter for immunization: Secondary | ICD-10-CM

## 2021-02-03 MED ORDER — PFIZER COVID-19 VAC BIVALENT 30 MCG/0.3ML IM SUSP
INTRAMUSCULAR | 0 refills | Status: DC
  Filled 2021-02-03: qty 0.3, 1d supply, fill #0

## 2021-02-03 NOTE — Progress Notes (Signed)
   Covid-19 Vaccination Clinic  Name:  Justin Shaw    MRN: [id number] DOB: [birth year]  02/03/2021  Justin Shaw was observed post Covid-19 immunization for 15 minutes without incident. He was provided with Vaccine Information Sheet and instruction to access the V-Safe system.   Justin Shaw was instructed to call 911 with any severe reactions post vaccine: Difficulty breathing  Swelling of face and throat  A fast heartbeat  A bad rash all over body  Dizziness and weakness   Immunizations Administered     Name Date Dose VIS Date Route   Pfizer Covid-19 Vaccine Bivalent Booster 02/03/2021  2:25 PM 0.3 mL 11/24/2020 Intramuscular   Manufacturer: [place]   Lot: [id number]   [place]: [phone]

## 2021-07-01 ENCOUNTER — Ambulatory Visit: Payer: Self-pay

## 2021-07-01 NOTE — Telephone Encounter (Signed)
Summary: paxlovid rx  ? Pt stated positive for covid today / had fever of 101.2 yesterday and sneezing / pts wife called to see if he can be approved for paxlovid/ she called the community line   ?  ?Called pt and LMOm to return call ?

## 2021-07-01 NOTE — Telephone Encounter (Signed)
Reason for Disposition ? [1] HIGH RISK for severe COVID complications (e.g., weak immune system, age > 93 years, obesity with BMI > 25, pregnant, chronic lung disease or other chronic medical condition) AND [2] COVID symptoms (e.g., cough, fever)  (Exceptions: Already seen by PCP and no new or worsening symptoms.) ?   Going to contact his PCP office for the on call dr. Or call the pharmacy at [facility] location as they prescribe Paxlovid. ? ?Answer Assessment - Initial Assessment Questions ?1. COVID-19 DIAGNOSIS: "Who made your COVID-19 diagnosis?" "Was it confirmed by a positive lab test or self-test?" If not diagnosed by a doctor (or NP/PA), ask "Are there lots of cases (community spread) where you live?" Note: See public health department website, if unsure. ?    Wife calling in.  Husband there putting in his hearing aids.   Tested positive for Covid.   Fever down was 101.2  taking Tylenol and it has brought the fever down.   Denies having chest discomfort or shortness of breath.   Being sleeping all morning.   Denies Vomiting or diarrhea.   Encouraged to drink plenty of fluids. ?She is going to try and contact his PCP and see if there is a dr. On call.   (Good Friday holiday) to prescribe Paxlovid.   If she can't get ahold of an on call dr. I let her know that some pharmacies can prescribed the Paxlovid.   I gave her the number at the [facility] location in [place].    She thanked me very much for my help.    ?No triage done as she wanted to know about getting the Paxlovid since he tested positive for Covid. ? ? ?2. COVID-19 EXPOSURE: "Was there any known exposure to COVID before the symptoms began?" CDC Definition of close contact: within 6 feet (2 meters) for a total of 15 minutes or more over a 24-hour period.  ?    *No Answer* ?3. ONSET: "When did the COVID-19 symptoms start?"  ?     ?4. WORST SYMPTOM: "What is your worst symptom?" (e.g., cough, fever, shortness of  breath, muscle aches) ?    *No Answer* ?5. COUGH: "Do you have a cough?" If Yes, ask: "How bad is the cough?"   ?    *No Answer* ?6. FEVER: "Do you have a fever?" If Yes, ask: "What is your temperature, how was it measured, and when did it start?" ?    *No Answer* ?7. RESPIRATORY STATUS: "Describe your breathing?" (e.g., shortness of breath, wheezing, unable to speak)  ?    *No Answer* ?8. BETTER-SAME-WORSE: "Are you getting better, staying the same or getting worse compared to yesterday?"  If getting worse, ask, "In what way?" ?    *No Answer* ?9. HIGH RISK DISEASE: "Do you have any chronic medical problems?" (e.g., asthma, heart or lung disease, weak immune system, obesity, etc.) ?    *No Answer* ?10. VACCINE: "Have you had the COVID-19 vaccine?" If Yes, ask: "Which one, how many shots, when did you get it?" ?      *No Answer* ?11. BOOSTER: "Have you received your COVID-19 booster?" If Yes, ask: "Which one and when did you get it?" ?      *No Answer* ?12. PREGNANCY: "Is there any chance you are pregnant?" "When was your last menstrual period?" ?      *No Answer* ?13. OTHER SYMPTOMS: "Do you have any other symptoms?"  (e.g., chills, fatigue, headache,  loss of smell or taste, muscle pain, sore throat) ?      *No Answer* ?14. O2 SATURATION MONITOR:  "Do you use an oxygen saturation monitor (pulse oximeter) at home?" If Yes, ask "What is your reading (oxygen level) today?" "What is your usual oxygen saturation reading?" (e.g., 95%) ?      *No Answer* ? ?Protocols used: Coronavirus (PNPYY-51) Diagnosed or Suspected-A-AH ? ?Chief Complaint: Wanting to know how to get started on Paxlovid for a positive Covid test and symptoms. ?Symptoms: sneezing, fever, fatigue ?Frequency: not asked ?Pertinent Negatives: Patient denies shortness of breath or chest tightness ?Disposition: '[]'$ ED /'[]'$ Urgent Care (no appt availability in office) / '[]'$ Appointment(In office/virtual)/ '[]'$  [place] [facility]/ '[]'$ Home Care/ '[]'$ Refused  Recommended Disposition /'[]'$ [facility] [facility]/ '[x]'$  Follow-up with PCP ?Additional Notes: Calling his PCP.    ?

## 2021-08-24 ENCOUNTER — Other Ambulatory Visit: Payer: Self-pay | Admitting: Internal Medicine

## 2021-08-24 ENCOUNTER — Ambulatory Visit
Admission: RE | Admit: 2021-08-24 | Discharge: 2021-08-24 | Disposition: A | Discharge status: Home / Self Care | Payer: Medicare Other | Source: Ambulatory Visit | Attending: Internal Medicine | Admitting: Internal Medicine

## 2021-08-24 ENCOUNTER — Other Ambulatory Visit (HOSPITAL_COMMUNITY): Payer: Self-pay | Admitting: Internal Medicine

## 2021-08-24 DIAGNOSIS — R4789 Other speech disturbances: Secondary | ICD-10-CM

## 2021-08-24 DIAGNOSIS — R42 Dizziness and giddiness: Secondary | ICD-10-CM

## 2021-08-25 ENCOUNTER — Ambulatory Visit (HOSPITAL_BASED_OUTPATIENT_CLINIC_OR_DEPARTMENT_OTHER)
Admission: RE | Admit: 2021-08-25 | Discharge: 2021-08-25 | Disposition: A | Discharge status: Home / Self Care | Payer: Medicare Other | Source: Ambulatory Visit | Attending: Internal Medicine | Admitting: Internal Medicine

## 2021-08-25 DIAGNOSIS — R4789 Other speech disturbances: Secondary | ICD-10-CM | POA: Insufficient documentation

## 2021-08-25 DIAGNOSIS — R42 Dizziness and giddiness: Secondary | ICD-10-CM | POA: Diagnosis present

## 2021-08-25 MED ORDER — GADOBUTROL 1 MMOL/ML IV SOLN
7.1000 mL | Freq: Once | INTRAVENOUS | Status: AC | PRN
  Administered 2021-08-25: 7.1 mL via INTRAVENOUS
  Filled 2021-08-25: qty 7.5

## 2021-10-17 ENCOUNTER — Observation Stay (HOSPITAL_COMMUNITY)
Admission: EM | Admit: 2021-10-17 | Discharge: 2021-10-20 | Disposition: A | Discharge status: Home Health | Payer: Medicare Other | Attending: Internal Medicine | Admitting: Internal Medicine

## 2021-10-17 ENCOUNTER — Emergency Department (HOSPITAL_COMMUNITY): Payer: Medicare Other

## 2021-10-17 DIAGNOSIS — Z79899 Other long term (current) drug therapy: Secondary | ICD-10-CM | POA: Diagnosis not present

## 2021-10-17 DIAGNOSIS — S139XXA Sprain of joints and ligaments of unspecified parts of neck, initial encounter: Secondary | ICD-10-CM | POA: Diagnosis not present

## 2021-10-17 DIAGNOSIS — N1831 Chronic kidney disease, stage 3a: Secondary | ICD-10-CM | POA: Diagnosis not present

## 2021-10-17 DIAGNOSIS — S0181XA Laceration without foreign body of other part of head, initial encounter: Secondary | ICD-10-CM | POA: Insufficient documentation

## 2021-10-17 DIAGNOSIS — I350 Nonrheumatic aortic (valve) stenosis: Secondary | ICD-10-CM | POA: Diagnosis not present

## 2021-10-17 DIAGNOSIS — N289 Disorder of kidney and ureter, unspecified: Secondary | ICD-10-CM

## 2021-10-17 DIAGNOSIS — W19XXXA Unspecified fall, initial encounter: Secondary | ICD-10-CM | POA: Insufficient documentation

## 2021-10-17 DIAGNOSIS — I5020 Unspecified systolic (congestive) heart failure: Secondary | ICD-10-CM | POA: Insufficient documentation

## 2021-10-17 DIAGNOSIS — I130 Hypertensive heart and chronic kidney disease with heart failure and stage 1 through stage 4 chronic kidney disease, or unspecified chronic kidney disease: Secondary | ICD-10-CM | POA: Diagnosis not present

## 2021-10-17 DIAGNOSIS — R55 Syncope and collapse: Principal | ICD-10-CM | POA: Insufficient documentation

## 2021-10-17 DIAGNOSIS — R1310 Dysphagia, unspecified: Secondary | ICD-10-CM | POA: Diagnosis not present

## 2021-10-17 DIAGNOSIS — Z23 Encounter for immunization: Secondary | ICD-10-CM | POA: Insufficient documentation

## 2021-10-17 DIAGNOSIS — Z20822 Contact with and (suspected) exposure to covid-19: Secondary | ICD-10-CM | POA: Diagnosis not present

## 2021-10-17 DIAGNOSIS — E785 Hyperlipidemia, unspecified: Secondary | ICD-10-CM | POA: Diagnosis present

## 2021-10-17 DIAGNOSIS — Z66 Do not resuscitate: Secondary | ICD-10-CM | POA: Diagnosis not present

## 2021-10-17 DIAGNOSIS — N179 Acute kidney failure, unspecified: Secondary | ICD-10-CM | POA: Diagnosis not present

## 2021-10-17 DIAGNOSIS — R2681 Unsteadiness on feet: Secondary | ICD-10-CM | POA: Insufficient documentation

## 2021-10-17 DIAGNOSIS — I10 Essential (primary) hypertension: Secondary | ICD-10-CM | POA: Diagnosis present

## 2021-10-17 LAB — CBC WITH DIFFERENTIAL/PLATELET
Abs Immature Granulocytes: 0.03 10*3/uL (ref 0.00–0.07)
Basophils Absolute: 0 10*3/uL (ref 0.0–0.1)
Basophils Relative: 0 %
Eosinophils Absolute: 0.2 10*3/uL (ref 0.0–0.5)
Eosinophils Relative: 2 %
HCT: 38.5 % — ABNORMAL LOW (ref 39.0–52.0)
Hemoglobin: 12.4 g/dL — ABNORMAL LOW (ref 13.0–17.0)
Immature Granulocytes: 0 %
Lymphocytes Relative: 13 %
Lymphs Abs: 1.5 10*3/uL (ref 0.7–4.0)
MCH: 31.6 pg (ref 26.0–34.0)
MCHC: 32.2 g/dL (ref 30.0–36.0)
MCV: 98 fL (ref 80.0–100.0)
Monocytes Absolute: 0.8 10*3/uL (ref 0.1–1.0)
Monocytes Relative: 7 %
Neutro Abs: 8.8 10*3/uL — ABNORMAL HIGH (ref 1.7–7.7)
Neutrophils Relative %: 78 %
Platelets: 217 10*3/uL (ref 150–400)
RBC: 3.93 MIL/uL — ABNORMAL LOW (ref 4.22–5.81)
RDW: 11.9 % (ref 11.5–15.5)
WBC: 11.4 10*3/uL — ABNORMAL HIGH (ref 4.0–10.5)
nRBC: 0 % (ref 0.0–0.2)

## 2021-10-17 LAB — BASIC METABOLIC PANEL
Anion gap: 8 (ref 5–15)
BUN: 31 mg/dL — ABNORMAL HIGH (ref 8–23)
CO2: 23 mmol/L (ref 22–32)
Calcium: 9 mg/dL (ref 8.9–10.3)
Chloride: 106 mmol/L (ref 98–111)
Creatinine, Ser: 1.74 mg/dL — ABNORMAL HIGH (ref 0.61–1.24)
GFR, Estimated: 38 mL/min — ABNORMAL LOW (ref >60)
Glucose, Bld: 109 mg/dL — ABNORMAL HIGH (ref 70–99)
Potassium: 4.7 mmol/L (ref 3.5–5.1)
Sodium: 137 mmol/L (ref 135–145)

## 2021-10-17 LAB — TROPONIN I (HIGH SENSITIVITY): Troponin I (High Sensitivity): 12 ng/L (ref <18)

## 2021-10-17 MED ORDER — TETANUS-DIPHTH-ACELL PERTUSSIS 5-2.5-18.5 LF-MCG/0.5 IM SUSY
0.5000 mL | PREFILLED_SYRINGE | Freq: Once | INTRAMUSCULAR | Status: AC
  Administered 2021-10-17: 0.5 mL via INTRAMUSCULAR
  Filled 2021-10-17: qty 0.5

## 2021-10-17 NOTE — ED Provider Notes (Signed)
[facility] EMERGENCY DEPARTMENT Provider Note   CSN: [id number] Arrival date & time: 10/17/21  2232     History {Add pertinent medical, surgical, social history, OB history to HPI:1} Chief Complaint  Patient presents with   Loss of Consciousness    MARKEES CARNS is a 87 y.o. male.  Patient presents to the emergency department for evaluation after a fall.  Patient has vertigo, had an episode of severe dizziness that caused him to fall over, off of the bed tonight.  He hit his head on the ground, suffered a laceration to the forehead.  At arrival to the ER, patient has no specific complaints.  Dizziness has resolved.  No other injuries noted.  Patient reports undergoing full work-up for the vertigo approximately a month ago including MRI.       Home Medications Prior to Admission medications   Medication Sig Start Date End Date Taking? Authorizing Provider  benazepril (LOTENSIN) 10 MG tablet Take 10 mg by mouth daily.  09/30/15   [provider]  Cholecalciferol (VITAMIN D3) 50 MCG (2000 UT) capsule Take 1 capsule by mouth daily.     [provider]  Coenzyme Q10 (COQ10) 200 MG CAPS Take 1 tablet by mouth daily.     [provider]  COVID-19 mRNA bivalent vaccine, Pfizer, (PFIZER COVID-19 VAC BIVALENT) injection Inject into the muscle. 02/03/21     COVID-19 mRNA Vac-TriS, Pfizer, (PFIZER-BIONT COVID-19 VAC-TRIS) SUSP injection Inject into the muscle. 07/30/20   Carlyle Basques, MD  DENTAGEL 1.1 % GEL dental gel See admin instructions. 01/27/20   [provider]  fluticasone (FLONASE) 50 MCG/ACT nasal spray Place 1 spray into both nostrils daily.    [provider]  Glucosamine 500 MG TABS Take 1 tablet by mouth daily.     [provider]  LIVALO 2 MG TABS Take 1 tablet by mouth daily.  12/11/15   [provider]      Allergies    Crestor [rosuvastatin calcium], Hydrocodone, Hydrocodone-acetaminophen,  Oxycodone-acetaminophen, and Rosuvastatin    Review of Systems   Review of Systems  Physical Exam Updated Vital Signs BP (!) 156/92   Pulse 82   Temp 98.5 F (36.9 C) (Oral)   Resp 18   Ht '5\' 10"'$  (1.778 m)   Wt 68.9 kg   SpO2 100%   BMI 21.81 kg/m  Physical Exam Vitals and nursing note reviewed.  Constitutional:      General: He is not in acute distress.    Appearance: He is well-developed.  HENT:     Head: Normocephalic. Laceration present.     Mouth/Throat:     Mouth: Mucous membranes are moist.  Eyes:     General: Vision grossly intact. Gaze aligned appropriately.     Extraocular Movements: Extraocular movements intact.     Conjunctiva/sclera: Conjunctivae normal.  Cardiovascular:     Rate and Rhythm: Normal rate and regular rhythm.     Pulses: Normal pulses.     Heart sounds: Normal heart sounds, S1 normal and S2 normal. No murmur heard.    No friction rub. No gallop.  Pulmonary:     Effort: Pulmonary effort is normal. No respiratory distress.     Breath sounds: Normal breath sounds.  Abdominal:     Palpations: Abdomen is soft.     Tenderness: There is no abdominal tenderness. There is no guarding or rebound.     Hernia: No hernia is present.  Musculoskeletal:  General: No swelling.     Cervical back: Full passive range of motion without pain, normal range of motion and neck supple. No pain with movement, spinous process tenderness or muscular tenderness. Normal range of motion.     Right lower leg: No edema.     Left lower leg: No edema.  Skin:    General: Skin is warm and dry.     Capillary Refill: Capillary refill takes less than 2 seconds.     Findings: No ecchymosis, erythema, lesion or wound.  Neurological:     Mental Status: He is alert and oriented to person, place, and time.     GCS: GCS eye subscore is 4. GCS verbal subscore is 5. GCS motor subscore is 6.     Cranial Nerves: Cranial nerves 2-12 are intact.     Sensory: Sensation is intact.      Motor: Motor function is intact. No weakness or abnormal muscle tone.     Coordination: Coordination is intact.     Comments: Extraocular muscle movement: normal No visual field cut Pupils: equal and reactive both direct and consensual response is normal No nystagmus present    Sensory function is intact to light touch, pinprick Proprioception intact  Grip strength 5/5 symmetric in upper extremities No pronator drift Normal finger to nose bilaterally  Lower extremity strength 5/5 against gravity Normal heel to shin bilaterally     Psychiatric:        Mood and Affect: Mood normal.        Speech: Speech normal.        Behavior: Behavior normal.     ED Results / Procedures / Treatments   Labs (all labs ordered are listed, but only abnormal results are displayed) Labs Reviewed  CBC WITH DIFFERENTIAL/PLATELET - Abnormal; Notable for the following components:      Result Value   WBC 11.4 (*)    RBC 3.93 (*)    Hemoglobin 12.4 (*)    HCT 38.5 (*)    Neutro Abs 8.8 (*)    All other components within normal limits  BASIC METABOLIC PANEL - Abnormal; Notable for the following components:   Glucose, Bld 109 (*)    BUN 31 (*)    Creatinine, Ser 1.74 (*)    GFR, Estimated 38 (*)    All other components within normal limits  TROPONIN I (HIGH SENSITIVITY)    EKG None  Radiology DG Chest Portable 1 View  Result Date: 10/17/2021 CLINICAL DATA:  Status post fall. EXAM: PORTABLE CHEST 1 VIEW COMPARISON:  May 11, 2006 FINDINGS: The heart size and mediastinal contours are within normal limits. Both lungs are clear. The visualized skeletal structures are unremarkable. IMPRESSION: No active cardiopulmonary disease. Electronically Signed   By: Virgina Norfolk M.D.   On: 10/17/2021 23:10    Procedures Procedures  {Document cardiac monitor, telemetry assessment procedure when appropriate:1}  Medications Ordered in ED Medications  Tdap (BOOSTRIX) injection 0.5 mL (has no  administration in time range)    ED Course/ Medical Decision Making/ A&P                           Medical Decision Making Risk Prescription drug management.   ***  {Document critical care time when appropriate:1} {Document review of labs and clinical decision tools ie heart score, Chads2Vasc2 etc:1}  {Document your independent review of radiology images, and any outside records:1} {Document your discussion with family members, caretakers, and  with consultants:1} {Document social determinants of health affecting pt's care:1} {Document your decision making why or why not admission, treatments were needed:1} Final Clinical Impression(s) / ED Diagnoses Final diagnoses:  None    Rx / DC Orders ED Discharge Orders     None

## 2021-10-18 ENCOUNTER — Emergency Department (HOSPITAL_COMMUNITY): Payer: Medicare Other

## 2021-10-18 ENCOUNTER — Encounter (HOSPITAL_COMMUNITY): Payer: Self-pay | Admitting: Internal Medicine

## 2021-10-18 ENCOUNTER — Observation Stay (HOSPITAL_BASED_OUTPATIENT_CLINIC_OR_DEPARTMENT_OTHER): Payer: Medicare Other

## 2021-10-18 DIAGNOSIS — Z66 Do not resuscitate: Secondary | ICD-10-CM | POA: Diagnosis not present

## 2021-10-18 DIAGNOSIS — S139XXA Sprain of joints and ligaments of unspecified parts of neck, initial encounter: Secondary | ICD-10-CM | POA: Diagnosis not present

## 2021-10-18 DIAGNOSIS — R55 Syncope and collapse: Secondary | ICD-10-CM

## 2021-10-18 DIAGNOSIS — I10 Essential (primary) hypertension: Secondary | ICD-10-CM

## 2021-10-18 DIAGNOSIS — N289 Disorder of kidney and ureter, unspecified: Secondary | ICD-10-CM

## 2021-10-18 DIAGNOSIS — R1310 Dysphagia, unspecified: Secondary | ICD-10-CM | POA: Diagnosis not present

## 2021-10-18 LAB — ECHOCARDIOGRAM COMPLETE
AR max vel: 0.92 cm2
AV Area VTI: 0.84 cm2
AV Area mean vel: 0.8 cm2
AV Mean grad: 17.3 mmHg
AV Peak grad: 28.4 mmHg
Ao pk vel: 2.66 m/s
Area-P 1/2: 4.6 cm2
Height: 70 in
S' Lateral: 4 cm
Weight: 2432 oz

## 2021-10-18 LAB — TROPONIN I (HIGH SENSITIVITY): Troponin I (High Sensitivity): 14 ng/L (ref <18)

## 2021-10-18 MED ORDER — LIDOCAINE-EPINEPHRINE (PF) 2 %-1:200000 IJ SOLN
10.0000 mL | Freq: Once | INTRAMUSCULAR | Status: AC
  Administered 2021-10-18: 10 mL
  Filled 2021-10-18: qty 20

## 2021-10-18 MED ORDER — SODIUM CHLORIDE 0.9% FLUSH
3.0000 mL | Freq: Two times a day (BID) | INTRAVENOUS | Status: DC
  Administered 2021-10-18 – 2021-10-20 (×3): 3 mL via INTRAVENOUS

## 2021-10-18 MED ORDER — FLUTICASONE PROPIONATE 50 MCG/ACT NA SUSP
1.0000 | Freq: Every day | NASAL | Status: DC
  Administered 2021-10-19 – 2021-10-20 (×2): 1 via NASAL
  Filled 2021-10-18: qty 16

## 2021-10-18 MED ORDER — ONDANSETRON HCL 4 MG/2ML IJ SOLN: 4.0000 mg | Freq: Four times a day (QID) | INTRAMUSCULAR | Status: DC | PRN

## 2021-10-18 MED ORDER — ACETAMINOPHEN 650 MG RE SUPP: 650.0000 mg | Freq: Four times a day (QID) | RECTAL | Status: DC | PRN

## 2021-10-18 MED ORDER — ALPRAZOLAM 0.5 MG PO TABS
0.5000 mg | ORAL_TABLET | Freq: Every evening | ORAL | Status: DC | PRN
  Administered 2021-10-19 (×2): 0.5 mg via ORAL
  Filled 2021-10-18: qty 2
  Filled 2021-10-18: qty 1

## 2021-10-18 MED ORDER — PRAVASTATIN SODIUM 40 MG PO TABS
40.0000 mg | ORAL_TABLET | Freq: Every day | ORAL | Status: DC
  Administered 2021-10-19: 40 mg via ORAL
  Filled 2021-10-18: qty 1

## 2021-10-18 MED ORDER — LACTATED RINGERS IV SOLN
INTRAVENOUS | Status: DC
Start: 2021-10-18 — End: 2021-10-20

## 2021-10-18 MED ORDER — ACETAMINOPHEN 325 MG PO TABS
650.0000 mg | ORAL_TABLET | Freq: Four times a day (QID) | ORAL | Status: DC | PRN
  Administered 2021-10-18 – 2021-10-19 (×3): 650 mg via ORAL
  Filled 2021-10-18 (×3): qty 2

## 2021-10-18 MED ORDER — HYDRALAZINE HCL 20 MG/ML IJ SOLN: 5.0000 mg | INTRAMUSCULAR | Status: DC | PRN

## 2021-10-18 MED ORDER — ONDANSETRON HCL 4 MG PO TABS: 4.0000 mg | ORAL_TABLET | Freq: Four times a day (QID) | ORAL | Status: DC | PRN

## 2021-10-18 MED ORDER — ENOXAPARIN SODIUM 40 MG/0.4ML IJ SOSY
40.0000 mg | PREFILLED_SYRINGE | INTRAMUSCULAR | Status: DC
  Administered 2021-10-18 – 2021-10-20 (×3): 40 mg via SUBCUTANEOUS
  Filled 2021-10-18 (×3): qty 0.4

## 2021-10-18 NOTE — Progress Notes (Signed)
  Echocardiogram 2D Echocardiogram has been performed.  Fidel Levy 10/18/2021, 10:35 AM

## 2021-10-18 NOTE — ED Notes (Signed)
Patient transported to MRI 

## 2021-10-18 NOTE — Plan of Care (Signed)
  Problem: Clinical Measurements: Goal: Respiratory complications will improve Outcome: Progressing   Problem: Activity: Goal: Risk for activity intolerance will decrease Outcome: Progressing   Problem: Safety: Goal: Ability to remain free from injury will improve Outcome: Progressing   

## 2021-10-18 NOTE — Evaluation (Signed)
Clinical/Bedside Swallow Evaluation Patient Details  Name: CHIEF WALKUP MRN: [id number] Date of Birth: Jul 26, 1934  Today's Date: 10/18/2021 Time: SLP Start Time (ACUTE ONLY): 44 SLP Stop Time (ACUTE ONLY): 1415 SLP Time Calculation (min) (ACUTE ONLY): 13 min  Past Medical History:  Past Medical History:  Diagnosis Date   Adenomatous colon polyp    Anal fissure    Chest pain    Diverticulosis    Dyslipidemia    Hypertension    Past Surgical History:  Past Surgical History:  Procedure Laterality Date   ANAL FISSURE REPAIR     COLONOSCOPY  02/23/2009   INGUINAL HERNIA REPAIR     TONSILLECTOMY     TRANSTHORACIC ECHOCARDIOGRAM  2007   NORMAL LV FUNCTION, AND MILD TRACE MITRAL AND TRICUSPID REGURGITATION   HPI:  87 y.o. male presented to ED after syncopal episode and fall, sustaining laceration to the head. hx of vertigo.   MRI with concerns for ligamentous injury/strain.  Neurosurgery recommends immobilization in an Aspen collar and OP f/u in their office.    Assessment / Plan / Recommendation  Clinical Impression  Pt participated in a clinical swallow assessment, presenting with thorough mastication, the appearance of a brisk swallow response, and no s/s of aspiration when tested with sequential swallows of water and mixed solid/liquid consistencies. No dysphagia identified. Resume a regular diet with thin liquids; meds with water. No SLP f/u needed. SLP Visit Diagnosis: Dysphagia, unspecified (R13.10)    Aspiration Risk  No limitations    Diet Recommendation   Regular solids, thin liquids  Medication Administration: Whole meds with liquid    Other  Recommendations Oral Care Recommendations: Oral care BID    Recommendations for follow up therapy are one component of a multi-disciplinary discharge planning process, led by the attending physician.  Recommendations may be updated based on patient status, additional functional criteria and insurance authorization.  Follow  up Recommendations No SLP follow up        [place] Date of Onset: 10/17/21 HPI: 87 y.o. male presented to ED after syncopal episode and fall, sustaining laceration to the head. hx of vertigo.   MRI with concerns for ligamentous injury/strain.  Neurosurgery recommends immobilization in an Aspen collar and OP f/u in their office. Type of Study: Bedside Swallow Evaluation Previous Swallow Assessment: no Diet Prior to this Study: NPO Temperature Spikes Noted: No Respiratory Status: Room air History of Recent Intubation: No Behavior/Cognition: Alert;Cooperative Oral Cavity Assessment: Within Functional Limits Oral Care Completed by SLP: No Oral Cavity - Dentition: Adequate natural dentition Vision: Functional for self-feeding Self-Feeding Abilities: Able to feed self Patient Positioning: Upright in chair Baseline Vocal Quality: Normal Volitional Cough: Strong Volitional Swallow: Able to elicit    Oral/Motor/Sensory Function Overall Oral Motor/Sensory Function: Within functional limits   Ice Chips Ice chips: Within functional limits   Thin Liquid Thin Liquid: Within functional limits    Nectar Thick Nectar Thick Liquid: Not tested   Honey Thick Honey Thick Liquid: Not tested   Puree Puree: Within functional limits   Solid     Solid: Within functional limits      Juan Quam Laurice 10/18/2021,2:59 PM  Estill Bamberg L. Tivis Ringer, MA CCC/SLP Clinical Specialist - [place] Office number [phone]

## 2021-10-18 NOTE — TOC Initial Note (Signed)
Transition of Care [facility]) - Initial/Assessment Note    Patient Details  Name: Justin Shaw MRN: [id number] Date of Birth: October 28, 1934  Transition of Care [facility]) CM/SW Contact:    Zenon Mayo, RN Phone Number: 10/18/2021, 5:03 PM  Clinical Narrative:                 Form home with wife, NCM offered choice from [email or web address], he states Alvis Lemmings is ok, NCM made referral to [facility] with [facility] for [place] , [place], he is able to take referral. Soc will begin 24 to 48 hrs post dc.  He is ok with [employer] supplying the rolling walker, NCM made referral to [facility] with [employer].  Daughter will transport home at dc.  TOC following.   Expected Discharge Plan: [place] Barriers to Discharge: Continued Medical Work up   Patient Goals and CMS Choice Patient states their goals for this hospitalization and ongoing recovery are:: return home CMS Medicare.gov Compare Post Acute Care list provided to:: Patient Choice offered to / list presented to : Patient  Expected Discharge Plan and Services Expected Discharge Plan: [place]   Discharge Planning Services: CM Consult Post Acute Care Choice: [place] arrangements for the past 2 months: Single Family Home                 DME Arranged: Walker rolling DME Agency: [employer] Date DME Agency Contacted: 10/18/21 Time DME Agency Contacted: [phone] Representative spoke with at DME Agency: Jodell Cipro HH Arranged: PT, OT [place] Agency: [place] Date [place]: 10/18/21 Time [place]: 18 Representative spoke with at [place]: Tommi Rumps  Prior Living Arrangements/Services Living arrangements for the past 2 months: [place] Lives with:: Spouse Patient language and need for interpreter reviewed:: Yes Do you feel safe going back to the place where you live?: Yes      Need for Family Participation in Patient Care: Yes (Comment) Care giver support system in place?: Yes  (comment)   Criminal Activity/Legal Involvement Pertinent to Current Situation/Hospitalization: No - Comment as needed  Activities of Daily Living   ADL Screening (condition at time of admission) Patient's cognitive ability adequate to safely complete daily activities?: Yes Is the patient deaf or have difficulty hearing?: Yes Does the patient have difficulty seeing, even when wearing glasses/contacts?: No Does the patient have difficulty concentrating, remembering, or making decisions?: No Patient able to express need for assistance with ADLs?: Yes Does the patient have difficulty dressing or bathing?: No Independently performs ADLs?: Yes (appropriate for developmental age) Does the patient have difficulty walking or climbing stairs?: No Weakness of Legs: None Weakness of Arms/Hands: None  Permission Sought/Granted                  Emotional Assessment Appearance:: Appears stated age Attitude/Demeanor/Rapport: Engaged Affect (typically observed): Appropriate Orientation: : Oriented to Place, Oriented to Self, Oriented to  Time, Oriented to Situation Alcohol / Substance Use: Not Applicable Psych Involvement: No (comment)  Admission diagnosis:  Syncope [R55] Patient Active Problem List   Diagnosis Date Noted   Syncope 10/18/2021   Acute sprain of ligament of neck 10/18/2021   Renal dysfunction 10/18/2021   Essential hypertension 10/18/2021   Dysphagia 10/18/2021   DNR (do not resuscitate) 10/18/2021   Special screening for malignant neoplasms, colon 01/25/2011   Benign neoplasm of colon 01/25/2011   Diverticulosis of colon (without mention of hemorrhage) 01/25/2011   Chest pain 09/16/2010  Hyperlipidemia 09/16/2010   PCP:  Crist Infante, MD Pharmacy:   [place], [place] [address] [place] [address] Phone: [phone] Fax: [phone]     Social Determinants of Health (SDOH) Interventions    Readmission  Risk Interventions     No data to display

## 2021-10-18 NOTE — Progress Notes (Signed)
87 year old male with syncopal episode and resultant fall.  Patient by reports at baseline.  Minimal if any neck pain.  No radiating pain numbness or weakness.  CT scan of the cervical spine demonstrates severe multilevel spondylitic disease without clear-cut evidence of fracture or instability.  MRI scanning demonstrates some signal abnormality both anterior and posterior to his cervical spine again questionable from some ligamentous injury/strain but no evidence of frank disruption or any evidence of instability.  No evidence of cord contusion or ongoing stenosis.  Recommend immobilization in an Aspen collar.  Patient may be mobilized ad lib. otherwise.  Follow-up with neurosurgery in 2 weeks in my office.

## 2021-10-18 NOTE — H&P (Signed)
History and Physical    Patient: Justin Shaw DOB: December 01, 1934 DOA: 10/17/2021 DOS: the patient was seen and examined on 10/18/2021 PCP: Justin Infante, MD  Patient coming from: Home - lives with wife; NOK: Wife, Justin Shaw, [phone]   Chief Complaint: Syncope  HPI: Justin Shaw is a 87 y.o. male with medical history significant of HTN and HLD presenting with syncope. He had a dizzy spell for 2 days.  He has had this once before.  He slept in a recliner for 2 nights because he has vertigo while lying flat.  He got in bed and he fell out of bed.  He doesn't remember anything about it.  He thinks it happened shortly after he got up.  He hurt his neck and head. He feels fine now.  No prior h/o syncope but he did get knocked out once in the past playing football.  He continues to work as a Gaffer.  He was working 7 days a week but recently cut back to 5.    ER Course:  Carryover, per Justin Shaw:  87 yo M with HTN, HLD presented after syncope and hitting his head. Forehead laceration repaired. Labs: WBC 11.4, Hgb 12.4, Cr 1.7. Trops negative. MRI brain showing scalp hematoma but no acute intracranial finding. MRI c spine showing evidence of acute ligamentous injury. No neuro deficits. Neurosurgery recommending c-collar and outpatient f/u.      Review of Systems: As mentioned in the history of present illness. All other systems reviewed and are negative. Past Medical History:  Diagnosis Date   Adenomatous colon polyp    Anal fissure    Chest pain    Diverticulosis    Dyslipidemia    Hypertension    Past Surgical History:  Procedure Laterality Date   ANAL FISSURE REPAIR     COLONOSCOPY  02/23/2009   INGUINAL HERNIA REPAIR     TONSILLECTOMY     TRANSTHORACIC ECHOCARDIOGRAM  2007   NORMAL LV FUNCTION, AND MILD TRACE MITRAL AND TRICUSPID REGURGITATION   Social History:  reports that he has never smoked. He has never  used smokeless tobacco. He reports current alcohol use. He reports that he does not use drugs.  Allergies  Allergen Reactions   Crestor [Rosuvastatin Calcium] Other (See Comments)    Chest pain and leg pain and joint pain   Hydrocodone Nausea And Vomiting   Hydrocodone-Acetaminophen Nausea And Vomiting   Oxycodone-Acetaminophen Nausea And Vomiting    Family History  Problem Relation Age of Onset   Hypertension Mother    Diabetes Mother    Stroke Mother    Colon cancer Neg Hx     Prior to Admission medications   Medication Sig Start Date End Date Taking? Authorizing Provider  benazepril (LOTENSIN) 10 MG tablet Take 10 mg by mouth daily.  09/30/15   [provider]  Cholecalciferol (VITAMIN D3) 50 MCG (2000 UT) capsule Take 1 capsule by mouth daily.     [provider]  Coenzyme Q10 (COQ10) 200 MG CAPS Take 1 tablet by mouth daily.     [provider]  COVID-19 mRNA bivalent vaccine, Pfizer, (PFIZER COVID-19 VAC BIVALENT) injection Inject into the muscle. 02/03/21     COVID-19 mRNA Vac-TriS, Pfizer, (PFIZER-BIONT COVID-19 VAC-TRIS) SUSP injection Inject into the muscle. 07/30/20   Justin Basques, MD  DENTAGEL 1.1 % GEL dental gel See admin instructions. 01/27/20   [provider]  fluticasone (FLONASE) 50 MCG/ACT nasal  spray Place 1 spray into both nostrils daily.    [provider]  Glucosamine 500 MG TABS Take 1 tablet by mouth daily.     [provider]  LIVALO 2 MG TABS Take 1 tablet by mouth daily.  12/11/15   [provider]    Physical Exam: Vitals:   10/18/21 1130 10/18/21 1213 10/18/21 1245 10/18/21 1527  BP: 136/88  131/85 118/70  Pulse: 94  (!) 103 95  Resp: '16  15 20  '$ Temp:  98.9 F (37.2 C)  98.7 F (37.1 C)  TempSrc:  Oral  Oral  SpO2: 99%  98% 96%  Weight:    67.8 kg  Height:    '5\' 10"'$  (1.778 m)   General:  Appears calm and comfortable and is in NAD, in a hard C-collar; s/p lac repair of R forehead  with sutures Eyes:  PERRL, EOMI, normal lids, iris ENT:  grossly normal hearing, lips & tongue, mmm Neck: hard C-collar in place Cardiovascular:  RRR, no m/r/g. No LE edema.  Respiratory:   CTA bilaterally with no wheezes/rales/rhonchi.  Normal respiratory effort. Abdomen:  soft, NT, ND Skin:  no rash or induration seen on limited exam; sutured lac along R forehead Musculoskeletal:  grossly normal tone BUE/BLE, good ROM, no bony abnormality Psychiatric:  grossly normal mood and affect, speech fluent and appropriate, AOx3 Neurologic:  CN 2-12 grossly intact, moves all extremities in coordinated fashion   Radiological Exams on Admission: Independently reviewed - see discussion in A/P where applicable  ECHOCARDIOGRAM COMPLETE  Result Date: 10/18/2021    ECHOCARDIOGRAM REPORT   Patient Name:   Justin Shaw Date of Exam: 10/18/2021 Medical Rec #:  [id number]        Height:       70.0 in Accession #:    [id number]       Weight:       152.0 lb Date of Birth:  1935/03/07        BSA:          1.858 m Patient Age:    54 years         BP:           122/87 mmHg Patient Gender: M                HR:           96 bpm. Exam Location:  Inpatient Procedure: 2D Echo, Cardiac Doppler and Color Doppler Indications:    Syncope R55  History:        Patient has no prior history of Echocardiogram examinations.                 Signs/Symptoms:Chest Pain; Risk Factors:Hypertension and                 Dyslipidemia.  Sonographer:    Justin Shaw RDCS Referring Phys: [id number] Justin Shaw  Sonographer Comments: Image acquisition challenging due to respiratory motion. IMPRESSIONS  1. Windows are challenging to fully assess wall motion. Left ventricular ejection fraction, by estimation, is 45 to 50%. The left ventricle has mildly decreased function. The left ventricle demonstrates global hypokinesis. Left ventricular diastolic parameters are consistent with Grade I diastolic dysfunction (impaired relaxation).  2. Right  ventricular systolic function is normal. The right ventricular size is normal. There is normal pulmonary artery systolic pressure.  3. The mitral valve is grossly normal. No evidence of mitral valve regurgitation.  4. Planimetry AVA 1.2 cm2. There is moderate  calcification of the aortic valve. Aortic valve regurgitation is not visualized. Mild aortic valve stenosis.  5. There is borderline dilatation of the ascending aorta, measuring 35 mm.  6. The inferior vena cava is normal in size with greater than 50% respiratory variability, suggesting right atrial pressure of 3 mmHg. Comparison(s): No prior Echocardiogram. FINDINGS  Left Ventricle: Windows are challenging to fully assess wall motion. Left ventricular ejection fraction, by estimation, is 45 to 50%. The left ventricle has mildly decreased function. The left ventricle demonstrates global hypokinesis. The left ventricular internal cavity size was normal in size. There is no left ventricular hypertrophy. Left ventricular diastolic parameters are consistent with Grade I diastolic dysfunction (impaired relaxation). Right Ventricle: The right ventricular size is normal. Right vetricular wall thickness was not well visualized. Right ventricular systolic function is normal. There is normal pulmonary artery systolic pressure. The tricuspid regurgitant velocity is 2.12 m/s, and with an assumed right atrial pressure of 3 mmHg, the estimated right ventricular systolic pressure is 22.9 mmHg. Left Atrium: Left atrial size was normal in size. Right Atrium: Right atrial size was normal in size. Pericardium: There is no evidence of pericardial effusion. Mitral Valve: The mitral valve is grossly normal. No evidence of mitral valve regurgitation. Tricuspid Valve: The tricuspid valve is grossly normal. Tricuspid valve regurgitation is trivial. Aortic Valve: Planimetry AVA 1.2 cm2. There is moderate calcification of the aortic valve. Aortic valve regurgitation is not visualized.  Mild aortic stenosis is present. Aortic valve mean gradient measures 17.3 mmHg. Aortic valve peak gradient measures 28.4 mmHg. Aortic valve area, by VTI measures 0.84 cm. Pulmonic Valve: The pulmonic valve was not well visualized. Pulmonic valve regurgitation is not visualized. Aorta: There is borderline dilatation of the ascending aorta, measuring 35 mm. Venous: The inferior vena cava is normal in size with greater than 50% respiratory variability, suggesting right atrial pressure of 3 mmHg. IAS/Shunts: The interatrial septum was not well visualized.  LEFT VENTRICLE PLAX 2D LVIDd:         5.30 cm   Diastology LVIDs:         4.00 cm   LV e' medial:    4.65 cm/s LV PW:         0.70 cm   LV E/e' medial:  8.5 LV IVS:        0.80 cm   LV e' lateral:   7.05 cm/s LVOT diam:     2.20 cm   LV E/e' lateral: 5.6 LV SV:         42 LV SV Index:   23 LVOT Area:     3.80 cm  RIGHT VENTRICLE RV S prime:     11.80 cm/s TAPSE (M-mode): 2.1 cm LEFT ATRIUM             Index        RIGHT ATRIUM          Index LA diam:        3.90 cm 2.10 cm/m   RA Area:     9.57 cm LA Vol (A2C):   42.5 ml 22.88 ml/m  RA Volume:   19.00 ml 10.23 ml/m LA Vol (A4C):   50.0 ml 26.92 ml/m LA Biplane Vol: 49.4 ml 26.59 ml/m  AORTIC VALVE AV Area (Vmax):    0.92 cm AV Area (Vmean):   0.80 cm AV Area (VTI):     0.84 cm AV Vmax:           266.33 cm/s AV Vmean:  195.667 cm/s AV VTI:            0.504 m AV Peak Grad:      28.4 mmHg AV Mean Grad:      17.3 mmHg LVOT Vmax:         64.30 cm/s LVOT Vmean:        41.200 cm/s LVOT VTI:          0.111 m LVOT/AV VTI ratio: 0.22  AORTA Ao Root diam: 3.50 cm Ao Asc diam:  3.50 cm MITRAL VALVE               TRICUSPID VALVE MV Area (PHT): 4.60 cm    TR Peak grad:   18.0 mmHg MV Decel Time: 165 msec    TR Vmax:        212.00 cm/s MV E velocity: 39.30 cm/s MV A velocity: 83.80 cm/s  SHUNTS MV E/A ratio:  0.47        Systemic VTI:  0.11 m                            Systemic Diam: 2.20 cm Phineas Inches  Electronically signed by Phineas Inches Signature Date/Time: 10/18/2021/11:28:38 AM    Final    MR Cervical Spine Wo Contrast  Result Date: 10/18/2021 CLINICAL DATA:  87 year old male status post blunt trauma with cervical spinal ankylosis and suspicion of acute injury at the cervicothoracic junction by CT. EXAM: MRI CERVICAL SPINE WITHOUT CONTRAST TECHNIQUE: Multiplanar, multisequence MR imaging of the cervical spine was performed. No intravenous contrast was administered. COMPARISON:  CT cervical spine 0115 hours today. Brain MRI 08/25/2021. FINDINGS: Alignment: Stable from the earlier CT. Straightening of cervical lordosis with 2-3 mm of anterolisthesis C7 on T1. Vertebrae: Ankylosis C2 through C5 (note right C4-C5 facet ankylosis) as demonstrated by CT. Degenerative appearing endplate changes C4 through C7. Subtle superior endplate compression at both T1 and T2 levels is not accompanied by convincing marrow edema. And no convincing acute osseous abnormality by MRI elsewhere. Background bone marrow signal is within normal limits. Cord: Limited spinal cord detail on axial images and sagittal stir due to motion artifact. No convincing visible cord signal abnormality. Capacious spinal canal at most levels. Posterior Fossa, vertebral arteries, paraspinal tissues: Chronic degenerative ligamentous hypertrophy about the odontoid. Cervicomedullary junction appears stable since June and otherwise negative. Brain is detailed separately today. Preserved major vascular flow voids in the neck. Mild patchy abnormal increased ligamentous STIR signal posteriorly C4-C5 through C6-C7 interspinous ligaments (series 7, image 19) and along the posterior left C6 facets (image 13). Associated trace abnormal prevertebral fluid or edema from C4 through T1 (series 7, image 8 and series 8, image 12). Furthermore, possible more generalized muscle edema in the lateral right longus coli on series 7, image 1. No other discrete muscle injury  identified. Otherwise negative visible neck soft tissues, right lung apex. Disc levels: C2-C3:  Ankylosis. C3-C4:  Ankylosis. C4-C5: Moderate to severe facet hypertrophy with ankylosis on the right side demonstrated by CT, possible also lateral interbody ankylosis related to bulky endplate spurring here. Associated severe bilateral C5 foraminal stenosis. C5-C6: Disc space loss with circumferential disc osteophyte complex and mild to moderate facet hypertrophy. Mild ligament flavum hypertrophy. Borderline to mild spinal stenosis. Moderate left and moderate to severe right C6 foraminal stenosis. C6-C7: Disc space loss with circumferential disc osteophyte complex. Moderate bilateral facet hypertrophy. Mild ligament flavum hypertrophy. Borderline to mild spinal stenosis. Moderate to  severe bilateral C7 foraminal stenosis greater on the left. C7-T1: Conspicuous increased T2 and STIR signal in the posterior disc space here (series 7, image 8). Underlying chronic appearing anterolisthesis with moderate bilateral facet hypertrophy. No convincing epidural blood. No spinal stenosis. No significant foraminal stenosis. IMPRESSION: 1. Evidence of mild to moderate acute anterior and posterior cervical ligamentous injury. Difficult to exclude acute injury to the posterior C7-T1 disc. And possible acute muscle injury of the right lateral longus coli. No convincing epidural hemorrhage or spinal cord injury. 2. Underlying cervical spine ankylosis C2 through C5 as demonstrated by CT. Mild superior endplate deformities of both T1 and T2 appear chronic. 3. Advanced cervical spine degeneration elsewhere. Moderate or severe neural foraminal stenosis from the bilateral C5 through C7 nerve levels. But only borderline to mild spinal stenosis at C5-C6 and C6-C7. Electronically Signed   By: Genevie Ann M.D.   On: 10/18/2021 05:14   MR BRAIN WO CONTRAST  Result Date: 10/18/2021 CLINICAL DATA:  87 year old male status post blunt trauma.  Persistent dizziness. EXAM: MRI HEAD WITHOUT CONTRAST TECHNIQUE: Multiplanar, multiecho pulse sequences of the brain and surrounding structures were obtained without intravenous contrast. COMPARISON:  Head CT earlier today.  Brain MRI 08/25/2021. FINDINGS: Brain: No restricted diffusion to suggest acute infarction. No midline shift, mass effect, evidence of mass lesion, ventriculomegaly, extra-axial collection or acute intracranial hemorrhage. Cervicomedullary junction and pituitary are within normal limits. Pearline Cables and white matter signal throughout the brain appears stable since June and largely normal for age. No convincing cortical encephalomalacia or chronic cerebral blood products. Mild T2 heterogeneity scattered in the deep gray matter and deep white matter capsules. Brainstem and cerebellum remain normal. Vascular: Major intracranial vascular flow voids are stable since June. There is a degree of generalized intracranial artery tortuosity. Skull and upper cervical spine: Dedicated cervical spine MRI reported separately. Evidence of upper cervical ankylosis with chronic degenerative ligamentous hypertrophy about the odontoid. Visualized bone marrow signal is within normal limits. Sinuses/Orbits: Stable, negative. Other: Mastoids remain clear. Right anterior superior convexity scalp hematoma, series 11, image 19. IMPRESSION: 1. No acute intracranial abnormality. Stable since June and largely normal for age noncontrast MRI appearance of the brain. 2. Right anterior convexity scalp hematoma. Dedicated Cervical Spine MRI reported separately. Electronically Signed   By: Genevie Ann M.D.   On: 10/18/2021 04:47   CT Cervical Spine Wo Contrast  Result Date: 10/18/2021 CLINICAL DATA:  Blunt facial trauma EXAM: CT CERVICAL SPINE WITHOUT CONTRAST TECHNIQUE: Multidetector CT imaging of the cervical spine was performed without intravenous contrast. Multiplanar CT image reconstructions were also generated. RADIATION DOSE  REDUCTION: This exam was performed according to the departmental dose-optimization program which includes automated exposure control, adjustment of the mA and/or kV according to patient size and/or use of iterative reconstruction technique. COMPARISON:  None Available. FINDINGS: Alignment: Slight anterior subluxation of C7 on T1. Suggestion of mild cortical irregularity at the superior anterior endplate of C7. Changes may represent acute ligamentous injury. MRI suggested for further evaluation. Skull base and vertebrae: Skull base appears intact. Slight cortical irregularity at the superior anterior endplate of C7 as discussed. No additional vertebral compression deformities. No focal bone lesion or bone destruction. Soft tissues and spinal canal: No prevertebral soft tissue swelling is identified. No abnormal paraspinal soft tissue mass or infiltration. Disc levels: Degenerative changes with disc space narrowing and endplate osteophyte formation throughout. Degenerative changes in the posterior facet joints. Uncovertebral spurring causes some encroachment upon the neural foramina bilaterally. Upper chest:  Negative. Other: None. IMPRESSION: 1. Slight anterior subluxation of C7 on T1 with suggestion of slight cortical compression of the anterior superior endplate of C7, possibly ligamentous injury. MRI suggested for further evaluation. 2. Degenerative changes throughout the cervical spine. Electronically Signed   By: Lucienne Capers M.D.   On: 10/18/2021 01:39   CT Head Wo Contrast  Result Date: 10/18/2021 CLINICAL DATA:  Syncope or presyncope with cerebrovascular cause suspected EXAM: CT HEAD WITHOUT CONTRAST TECHNIQUE: Contiguous axial images were obtained from the base of the skull through the vertex without intravenous contrast. RADIATION DOSE REDUCTION: This exam was performed according to the departmental dose-optimization program which includes automated exposure control, adjustment of the mA and/or kV  according to patient size and/or use of iterative reconstruction technique. COMPARISON:  CT head 08/24/2021.  MRI brain 08/25/2021 FINDINGS: Brain: No evidence of acute infarction, hemorrhage, hydrocephalus, extra-axial collection or mass lesion/mass effect. Mild cerebral atrophy. Vascular: No hyperdense vessel or unexpected calcification. Skull: Normal. Negative for fracture or focal lesion. Sinuses/Orbits: No acute finding. Other: None. IMPRESSION: No acute intracranial abnormalities.  Mild cerebral atrophy. Electronically Signed   By: Lucienne Capers M.D.   On: 10/18/2021 01:36   DG Chest Portable 1 View  Result Date: 10/17/2021 CLINICAL DATA:  Status post fall. EXAM: PORTABLE CHEST 1 VIEW COMPARISON:  May 11, 2006 FINDINGS: The heart size and mediastinal contours are within normal limits. Both lungs are clear. The visualized skeletal structures are unremarkable. IMPRESSION: No active cardiopulmonary disease. Electronically Signed   By: Virgina Norfolk M.D.   On: 10/17/2021 23:10    EKG: Independently reviewed.  NSR with rate 96; no evidence of acute ischemia   Labs on Admission: I have personally reviewed the available labs and imaging studies at the time of the admission.  Pertinent labs:    Glucose 109 BUN 31/Creatinine 1.74/GFR 38 HS troponin 12, 14 WBC 11.4   Assessment and Plan: Principal Problem:   Syncope Active Problems:   Hyperlipidemia   Acute sprain of ligament of neck   Renal dysfunction   Essential hypertension   Dysphagia   DNR (do not resuscitate)    Syncope -He had 2 days of vertigo leading up to today's event -Etiology is not clear. The differential diagnosis is broad, including vasovagal syncope, seizure, TIA/stroke, arrhythmia, ACS, hypoglycemia, alcohol intoxication, drug abuse, orthostatic status, carotid artery stenosis -Severity of the injury sustained during syncope does not correlate with the etiology of syncope, but rather is a manifestation of  activity around the time of syncope; he had a head lac and ligamentous neck injury -Patients at highest risk from syncope include those with serious comorbidities; age >1; exertional > supine syncope; palpitations; abnormal EKG; abnormal vital signs; or abnormal exam. -This patient is at moderate/high risk for serious outcome and thus should be observed overnight on telemetry in the hospital. -Orthostatic vital signs now and in AM -2d echo ordered -Neuro checks  -PT/OT eval and treat  Ligamentous neck injury -MRI demonstrated injury -Normal neuro exam -Neurosurgery was consulted by telephone and recommended C-collar and f/u in 2 weeks as outpatient  Renal dysfunction -Creatinine/GFR are abnormal but there are no available priors in Epic/Care Everywhere -Likely CKD but will hold benazepril for now -Gently hydrate -Recheck BMP in AM  HTN -Has been on benazepril -Currently with renal dysfunction, will hold -Will cover with prn IV hydralazine  HLD -Continue Livalo (pravastatin per formulary)  Dysphagia -During the evaluation, the patient requested water -He was pretty supine given the  hard C-collar and experienced a choking episode and then continued to cough with subsequent sips despite being sat up -He endorses occasional pill dysphagia -Will request formal swallow evaluation  DNR -I have discussed code status with the patient and he would prefer to die a natural death should that situation arise. -He will need a gold out of facility DNR form at the time of discharge     Advance Care Planning:   Code Status: DNR   Consults: Neurosurgery (telephone only); PT/OT/ST  DVT Prophylaxis: Lovenox  Family Communication: None present; I called and left a message for his wife on voicemail  Severity of Illness: The appropriate patient status for this patient is OBSERVATION. Observation status is judged to be reasonable and necessary in order to provide the required intensity of  service to ensure the patient's safety. The patient's presenting symptoms, physical exam findings, and initial radiographic and laboratory data in the context of their medical condition is felt to place them at decreased risk for further clinical deterioration. Furthermore, it is anticipated that the patient will be medically stable for discharge from the hospital within 2 midnights of admission.   Author: Karmen Bongo, MD 10/18/2021 3:55 PM  For on call review [email or web address].

## 2021-10-18 NOTE — Evaluation (Signed)
Physical Therapy Evaluation Patient Details Name: Justin Shaw MRN: [id number] DOB: [birth year] Today's Date: 10/18/2021  History of Present Illness  87 y.o. male presented to ED after syncopal episode and fall, sustaining laceration to the head. hx of vertigo.   MRI with concerns for ligamentous injury/strain.  Neurosurgery recommends immobilization in an Aspen collar and OP f/u in their office.  Clinical Impression   Patient presents with decreased mobility due to decreased balance, generalized weakness and some continued dizziness.  Previously functioning independently driving, working at busy pharmacy and helping wife with household chores during her recovery from [place].  Today he needed min A for mobility initially with HHA and pushing the IV pole due to posterior bias and dizziness upon sitting up.  Improved during ambulation and able to take hands off IV pole and walk with minguard support by the end of this session.  He remains high fall risk and we discussed using RW at home and getting HHPT.  Hopeful to practice with RW with further PT prior to d/c home.  Will follow up.        Recommendations for follow up therapy are one component of a multi-disciplinary discharge planning process, led by the attending physician.  Recommendations may be updated based on patient status, additional functional criteria and insurance authorization.  Follow Up Recommendations Home health PT      Assistance Recommended at Discharge Intermittent Supervision/Assistance  Patient can return home with the following  A little help with walking and/or transfers;Assistance with cooking/housework;Assist for transportation;Help with stairs or ramp for entrance    Equipment Recommendations Rolling walker (2 wheels)  Recommendations for Other Services       Functional Status Assessment Patient has had a recent decline in their functional status and demonstrates the ability to make significant improvements in  function in a reasonable and predictable amount of time.     Precautions / Restrictions Precautions Precautions: Fall Required Braces or Orthoses: Cervical Brace Cervical Brace: Hard collar;At all times      Mobility  Bed Mobility Overal bed mobility: Needs Assistance Bed Mobility: Rolling, Sidelying to Sit Rolling: Min assist Sidelying to sit: Mod assist       General bed mobility comments: cues for technique due to spinal precautions    Transfers Overall transfer level: Needs assistance Equipment used: 1 person hand held assist Transfers: Sit to/from Stand Sit to Stand: Min assist           General transfer comment: up from gurney with A for balance, initial posterior bias    Ambulation/Gait Ambulation/Gait assistance: Min assist, Min guard Gait Distance (Feet): 200 Feet Assistive device: 1 person hand held assist, None, IV Pole Gait Pattern/deviations: Step-through pattern, Decreased stride length, Shuffle, Trunk flexed       General Gait Details: initially with heavy HHA on R and pt pushing IV pole, with cues for foot clearance due to shuffling; after ambulated good distance pt ambulating without device with min A for safety to bathroom; after toileting and hand washing, walked to room with minguard A no device.  Stairs            Wheelchair Mobility    Modified Rankin (Stroke Patients Only)       Balance Overall balance assessment: Needs assistance Sitting-balance support: Feet supported Sitting balance-Leahy Scale: Good     Standing balance support: Single extremity supported, No upper extremity supported, During functional activity Standing balance-Leahy Scale: Poor Standing balance comment: poor initially with posterior lean, progressing  to fair, able to stand to urinate, wash hands no UE support with supervision                             Pertinent Vitals/Pain Pain Assessment Pain Assessment: No/denies pain    Home  Living Family/patient expects to be discharged to:: Private residence Living Arrangements: Spouse/significant other Available Help at Discharge: Family Type of Home: House Home Access: Stairs to enter   Technical brewer of Steps: 1   Home Layout: One level Home Equipment: Conservation officer, nature (2 wheels) Additional Comments: wife using RW after THA few weeks ago    Prior Function Prior Level of Function : Independent/Modified Independent             Mobility Comments: 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, fashion/clothing        Extremity/Trunk Assessment   Upper Extremity Assessment Upper Extremity Assessment: Defer to OT evaluation    Lower Extremity Assessment Lower Extremity Assessment: Generalized weakness    Cervical / Trunk Assessment Cervical / Trunk Assessment: Normal  Communication   Communication: HOH (hearing aides at home)  Cognition Arousal/Alertness: Awake/alert Behavior During Therapy: WFL for tasks assessed/performed Overall Cognitive Status: Within Functional Limits for tasks assessed                                          General Comments General comments (skin integrity, edema, etc.): wearing c-collar; dizzy upon sitting up needing time to settle, did not check for positional vertigo due to c-collar and cervical precautions.    Exercises     Assessment/Plan    PT Assessment Patient needs continued PT services  PT Problem List Decreased strength;Decreased mobility;Decreased balance;Decreased knowledge of use of DME;Impaired sensation;Decreased safety awareness       PT Treatment Interventions DME instruction;Therapeutic activities;Cognitive remediation;Gait training;Therapeutic exercise;Patient/family education;Balance training;Functional mobility training    PT Goals (Current goals can be found in the Care Plan section)  Acute Rehab PT Goals Patient Stated Goal: to return home PT Goal Formulation: With  patient Time For Goal Achievement: 11/01/21 Potential to Achieve Goals: Good    Frequency Min 4X/week     Co-evaluation               AM-PAC PT "6 Clicks" Mobility  Outcome Measure Help needed turning from your back to your side while in a flat bed without using bedrails?: A Little Help needed moving from lying on your back to sitting on the side of a flat bed without using bedrails?: A Little Help needed moving to and from a bed to a chair (including a wheelchair)?: A Little Help needed standing up from a chair using your arms (e.g., wheelchair or bedside chair)?: A Little Help needed to walk in hospital room?: A Little Help needed climbing 3-5 steps with a railing? : Total 6 Click Score: 16    End of Session Equipment Utilized During Treatment: Gait belt Activity Tolerance: Patient tolerated treatment well Patient left: in chair;with call bell/phone within reach;with nursing/sitter in room   PT Visit Diagnosis: Other abnormalities of gait and mobility (R26.89);History of falling (Z91.81);Dizziness and giddiness (R42)    Time: 1140-1210 PT Time Calculation (min) (ACUTE ONLY): 30 min   Charges:   PT Evaluation $PT Eval Moderate Complexity: 1 Mod PT Treatments $Gait Training: 8-22 mins  Magda Kiel, PT Acute Rehabilitation Services Office:[phone] 10/18/2021   Reginia Naas 10/18/2021, 3:51 PM

## 2021-10-19 ENCOUNTER — Observation Stay (HOSPITAL_BASED_OUTPATIENT_CLINIC_OR_DEPARTMENT_OTHER): Payer: Medicare Other

## 2021-10-19 ENCOUNTER — Observation Stay (HOSPITAL_COMMUNITY): Payer: Medicare Other

## 2021-10-19 DIAGNOSIS — I10 Essential (primary) hypertension: Secondary | ICD-10-CM | POA: Diagnosis not present

## 2021-10-19 DIAGNOSIS — I519 Heart disease, unspecified: Secondary | ICD-10-CM | POA: Diagnosis not present

## 2021-10-19 DIAGNOSIS — S0181XA Laceration without foreign body of other part of head, initial encounter: Secondary | ICD-10-CM | POA: Diagnosis not present

## 2021-10-19 DIAGNOSIS — R55 Syncope and collapse: Secondary | ICD-10-CM | POA: Diagnosis not present

## 2021-10-19 DIAGNOSIS — I420 Dilated cardiomyopathy: Secondary | ICD-10-CM | POA: Diagnosis not present

## 2021-10-19 DIAGNOSIS — Z20822 Contact with and (suspected) exposure to covid-19: Secondary | ICD-10-CM | POA: Diagnosis not present

## 2021-10-19 DIAGNOSIS — Z23 Encounter for immunization: Secondary | ICD-10-CM | POA: Diagnosis not present

## 2021-10-19 LAB — CBC
HCT: 34.8 % — ABNORMAL LOW (ref 39.0–52.0)
Hemoglobin: 11.6 g/dL — ABNORMAL LOW (ref 13.0–17.0)
MCH: 32.1 pg (ref 26.0–34.0)
MCHC: 33.3 g/dL (ref 30.0–36.0)
MCV: 96.4 fL (ref 80.0–100.0)
Platelets: 196 10*3/uL (ref 150–400)
RBC: 3.61 MIL/uL — ABNORMAL LOW (ref 4.22–5.81)
RDW: 11.9 % (ref 11.5–15.5)
WBC: 9.5 10*3/uL (ref 4.0–10.5)
nRBC: 0 % (ref 0.0–0.2)

## 2021-10-19 LAB — BASIC METABOLIC PANEL
Anion gap: 7 (ref 5–15)
BUN: 25 mg/dL — ABNORMAL HIGH (ref 8–23)
CO2: 24 mmol/L (ref 22–32)
Calcium: 8.8 mg/dL — ABNORMAL LOW (ref 8.9–10.3)
Chloride: 106 mmol/L (ref 98–111)
Creatinine, Ser: 1.68 mg/dL — ABNORMAL HIGH (ref 0.61–1.24)
GFR, Estimated: 40 mL/min — ABNORMAL LOW (ref >60)
Glucose, Bld: 119 mg/dL — ABNORMAL HIGH (ref 70–99)
Potassium: 4.8 mmol/L (ref 3.5–5.1)
Sodium: 137 mmol/L (ref 135–145)

## 2021-10-19 LAB — ECHOCARDIOGRAM LIMITED
Calc EF: 50.4 %
Height: 70 in
Single Plane A2C EF: 58.3 %
Single Plane A4C EF: 46.3 %
Weight: 2425.6 oz

## 2021-10-19 LAB — TSH: TSH: 0.682 u[IU]/mL (ref 0.350–4.500)

## 2021-10-19 LAB — SODIUM, URINE, RANDOM: Sodium, Ur: 79 mmol/L

## 2021-10-19 MED ORDER — VITAMIN D 25 MCG (1000 UNIT) PO TABS
2000.0000 [IU] | ORAL_TABLET | Freq: Every day | ORAL | Status: DC
  Administered 2021-10-19 – 2021-10-20 (×2): 2000 [IU] via ORAL
  Filled 2021-10-19 (×2): qty 2

## 2021-10-19 MED ORDER — PERFLUTREN LIPID MICROSPHERE
1.0000 mL | INTRAVENOUS | Status: AC | PRN
  Administered 2021-10-19: 2 mL via INTRAVENOUS

## 2021-10-19 NOTE — Evaluation (Signed)
Occupational Therapy Evaluation Patient Details Name: Justin Shaw MRN: [id number] DOB: Dec 13, 1934 Today's Date: 10/19/2021   History of Present Illness 87 y.o. male presented to ED on 10/17/2021 after syncopal episode and fall, sustaining laceration to the head. hx of vertigo.   MRI with concerns for ligamentous injury/strain.  Neurosurgery recommends immobilization in an Aspen collar and OP f/u in their office.   Clinical Impression   Pt PLOF: Pt living with family and reports independence. Pt currently, performing OOB ADL tasks with cervical precaution education utilized. Pt able to perform figure 4 pattern for LB dressing. Pt standing at sink for OOB ADL tasks x5 mins. Pt mobilizing well without an AD at this time. No LOB episodes noted. Pt reported no pain.  Pt's HR increases from 115 BPM at rest to 140 BPM with talking in standing resumed <130 BPM by sitting down.  Pt asymptomatic. Pt does not require continued OT skilled services. OT signing off.  Cervical handout provided and reviewed ADL in detail. Pt educated on: set an alarm at night for medication, avoid sitting for long periods of time, correct bed positioning for sleeping, correct sequence for bed mobility, avoiding lifting more than 5 pounds and never wash directly over incision. All education is complete and patient indicates understanding.      Recommendations for follow up therapy are one component of a multi-disciplinary discharge planning process, led by the attending physician.  Recommendations may be updated based on patient status, additional functional criteria and insurance authorization.   Follow Up Recommendations  Follow physician's recommendations for discharge plan and follow up therapies    Assistance Recommended at Discharge PRN  Patient can return home with the following Assist for transportation;Help with stairs or ramp for entrance    Functional Status Assessment  Patient has had a recent decline in their  functional status and demonstrates the ability to make significant improvements in function in a reasonable and predictable amount of time.  Equipment Recommendations  None recommended by OT    Recommendations for Other Services       Precautions / Restrictions Precautions Precautions: Fall Precaution Comments: handout provided Required Braces or Orthoses: Cervical Brace Cervical Brace: Hard collar;At all times Restrictions Weight Bearing Restrictions: No      Mobility Bed Mobility               General bed mobility comments: in recliner pre and post session    Transfers Overall transfer level: Needs assistance Equipment used: None Transfers: Sit to/from Stand Sit to Stand: Supervision                  Balance Overall balance assessment: Needs assistance Sitting-balance support: No upper extremity supported, Feet supported Sitting balance-Leahy Scale: Good     Standing balance support: No upper extremity supported, During functional activity Standing balance-Leahy Scale: Good                             ADL either performed or assessed with clinical judgement   ADL Overall ADL's : Modified independent;At baseline                                       General ADL Comments: Pt performing OOB ADL tasks with cervical precaution education utilized. Pt able to perform figure 4 pattern for LB dressing. Pt standing at sink for  OOB ADL tasks x5 mins. Pt mobilizing well without an AD at this time. No LOB episodes noted. Pt reported no pain.     Vision Baseline Vision/History: 1 Wears glasses Ability to See in Adequate Light: 0 Adequate Patient Visual Report: No change from baseline Vision Assessment?: No apparent visual deficits     Perception     Praxis      Pertinent Vitals/Pain Pain Assessment Pain Assessment: No/denies pain     Hand Dominance Right   Extremity/Trunk Assessment Upper Extremity Assessment Upper  Extremity Assessment: Overall WFL for tasks assessed   Lower Extremity Assessment Lower Extremity Assessment: Overall WFL for tasks assessed   Cervical / Trunk Assessment Cervical / Trunk Assessment: Normal   Communication Communication Communication: HOH   Cognition Arousal/Alertness: Awake/alert Behavior During Therapy: WFL for tasks assessed/performed Overall Cognitive Status: Within Functional Limits for tasks assessed                                       General Comments  115 BPM at rest; 140 BPM with talking in standing resumed <130 BPM by sitting down.    Exercises     Shoulder Instructions      Home Living Family/patient expects to be discharged to:: Private residence Living Arrangements: Spouse/significant other Available Help at Discharge: Family Type of Home: House Home Access: Stairs to enter Technical brewer of Steps: 1   Home Layout: One level     Bathroom Shower/Tub: Hospital doctor Toilet: Handicapped height     [place]: Conservation officer, nature (2 wheels);Grab bars - toilet;Shower seat;Grab bars - tub/shower          Prior Functioning/Environment Prior Level of Function : Independent/Modified Independent             Mobility Comments: 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 Problem List: Decreased activity tolerance      OT Treatment/Interventions:      OT Goals(Current goals can be found in the care plan section) Acute Rehab OT Goals Patient Stated Goal: to go home OT Goal Formulation: All assessment and education complete, DC therapy Time For Goal Achievement: 11/03/21 Potential to Achieve Goals: Good  OT Frequency:      Co-evaluation              AM-PAC OT "6 Clicks" Daily Activity     Outcome Measure Help from another person eating meals?: None Help from another person taking care of personal grooming?: None Help from another person toileting, which includes using toliet,  bedpan, or urinal?: None Help from another person bathing (including washing, rinsing, drying)?: A Little Help from another person to put on and taking off regular upper body clothing?: None Help from another person to put on and taking off regular lower body clothing?: A Little 6 Click Score: 22   End of Session Equipment Utilized During Treatment: Gait belt Nurse Communication: Mobility status  Activity Tolerance: Patient tolerated treatment well Patient left: in chair;with call bell/phone within reach;with chair alarm set;with family/visitor present  OT Visit Diagnosis: Unsteadiness on feet (R26.81)                Time: 0825-0900 OT Time Calculation (min): 35 min Charges:  OT General Charges $OT Visit: 1 Visit OT Evaluation $OT Eval Moderate Complexity: 1 Mod OT Treatments $Self Care/Home Management : 8-22 mins  Jefferey Pica, OTR/L Acute Rehabilitation Services Office: [phone]   Gus Littler C 10/19/2021, 11:02 AM

## 2021-10-19 NOTE — Care Management Obs Status (Signed)
[place] NOTIFICATION   Patient Details  Name: SHIRO ELLERMAN MRN: [id number] Date of Birth: Oct 17, 1934   Medicare Observation Status Notification Given:  Yes    Zenon Mayo, RN 10/19/2021, 11:17 AM

## 2021-10-19 NOTE — Progress Notes (Signed)
PROGRESS NOTE  Justin Shaw  UYQ:[id number] DOB: Nov 09, 1934 DOA: 10/17/2021 PCP: Crist Infante, MD   Brief Narrative:  Patient is a 87 year old male with history of hypertension, hyperlipidemia presented to the emergency department with complaint of neck pain after he fell out of bed at home.  Patient is independent, fully functional, ambulatory at baseline.  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  MRI cervical spine done here showed evidence of acute ligamentous injury.  No neurodeficits.  Neurosurgery recommended c-collar and outpatient follow-up.  Echocardiogram done as a part of syncopal episode showed low ejection fraction, cardiology consulted.  Assessment & Plan:  Principal Problem:   Syncope Active Problems:   Hyperlipidemia   Acute sprain of ligament of neck   Renal dysfunction   Essential hypertension   Dysphagia   DNR (do not resuscitate)   Syncope/fall/neck injury: Reported 2 days history of vertigo which is resolved now.  He fell out of the bed and hurt his neck.  MRI of the cervical spine showed mild to moderate acute anterior and posterior cervical ligamentous injury.  Neurosurgery was consulted who recommended outpatient follow-up.  Continue c-collar.  Pain is well controlled.  PT recommended home health on discharge.  Systolic congestive heart failure: Clinically he appears euvolemic, no signs of heart failure.  Echo showed EF of 45- 50%, global hypokinesis, grade 1 diastolic dysfunction.  Cardiology consulted today. He is also in sinus tachycardia, checking TSH  AKI versus CKD: No previous lab work found.  Takes benazepril at home was on hold.  Currently creatinine in the range of 1.6.  We will stop IV fluids.  Currently appears euvolemic.  Check BMP tomorrow We will check renal ultrasound, urine sodium  Hypertension: Takes benazepril at home.  Currently normotensive.  Currently benazepril on hold.  We will likely discontinue this medication due to renal  dysfunction  Hyperlipidemia: On pravastatin  Dysphagia: Speech therapy saw the patient and cleared for solid diet         DVT prophylaxis:enoxaparin (LOVENOX) injection 40 mg Start: 10/18/21 1000     Code Status: DNR  Family Communication: Daughter at bedside  Patient status:Obs  Patient is from : Home  Anticipated discharge to: Home  Estimated DC date: In 1 to 2 days, pending cardiology work-up, pending work-up for renal dysfunction   Consultants: Cardiology  Procedures: None  Antimicrobials:  Anti-infectives (From admission, onward)    None       Subjective: Patient seen and examined at the bedside this morning.  Daughter at the bedside.  Appears comfortable, sitting in the chair.  On c-collar.  Very hard of hearing.  Denies any worsening neck pain.  Objective: Vitals:   10/18/21 2035 10/19/21 0121 10/19/21 0459 10/19/21 1045  BP: (!) 118/91 122/76 132/71 123/67  Pulse: 90 82 84   Resp: '18 18 18   '$ Temp: 98.1 F (36.7 C) 98.5 F (36.9 C) 98 F (36.7 C)   TempSrc: Oral Oral Oral   SpO2:  99% 99%   Weight:   68.8 kg   Height:        Intake/Output Summary (Last 24 hours) at 10/19/2021 1055 Last data filed at 10/19/2021 0500 Gross per 24 hour  Intake 1217.35 ml  Output 950 ml  Net 267.35 ml   Filed Weights   10/17/21 2243 10/18/21 1527 10/19/21 0459  Weight: 68.9 kg 67.8 kg 68.8 kg    Examination:  General exam: Overall comfortable, not in distress, pleasant elderly gentleman, hard of hearing HEENT: PERRL,  cervical collar Respiratory system:  no wheezes or crackles  Cardiovascular system: S1 & S2 heard, RRR.  Gastrointestinal system: Abdomen is nondistended, soft and nontender. Central nervous system: Alert and oriented Extremities: No edema, no clubbing ,no cyanosis Skin: No rashes, no ulcers,no icterus     Data Reviewed: I have personally reviewed following labs and imaging studies  CBC: Recent Labs  Lab 10/17/21 2245 10/19/21 0149   WBC 11.4* 9.5  NEUTROABS 8.8*  --   HGB 12.4* 11.6*  HCT 38.5* 34.8*  MCV 98.0 96.4  PLT 217 867   Basic Metabolic Panel: Recent Labs  Lab 10/17/21 2245 10/19/21 0149  NA 137 137  K 4.7 4.8  CL 106 106  CO2 23 24  GLUCOSE 109* 119*  BUN 31* 25*  CREATININE 1.74* 1.68*  CALCIUM 9.0 8.8*     No results found for this or any previous visit (from the past 240 hour(s)).   Radiology Studies: ECHOCARDIOGRAM COMPLETE  Result Date: 10/18/2021    ECHOCARDIOGRAM REPORT   Patient Name:   Justin Shaw Date of Exam: 10/18/2021 Medical Rec #:  [id number]        Height:       70.0 in Accession #:    [id number]       Weight:       152.0 lb Date of Birth:  15-Jun-1934        BSA:          1.858 m Patient Age:    34 years         BP:           122/87 mmHg Patient Gender: M                HR:           96 bpm. Exam Location:  Inpatient Procedure: 2D Echo, Cardiac Doppler and Color Doppler Indications:    Syncope R55  History:        Patient has no prior history of Echocardiogram examinations.                 Signs/Symptoms:Chest Pain; Risk Factors:Hypertension and                 Dyslipidemia.  Sonographer:    Bernadene Person RDCS Referring Phys: [id number] JENNIFER YATES  Sonographer Comments: Image acquisition challenging due to respiratory motion. IMPRESSIONS  1. Windows are challenging to fully assess wall motion. Left ventricular ejection fraction, by estimation, is 45 to 50%. The left ventricle has mildly decreased function. The left ventricle demonstrates global hypokinesis. Left ventricular diastolic parameters are consistent with Grade I diastolic dysfunction (impaired relaxation).  2. Right ventricular systolic function is normal. The right ventricular size is normal. There is normal pulmonary artery systolic pressure.  3. The mitral valve is grossly normal. No evidence of mitral valve regurgitation.  4. Planimetry AVA 1.2 cm2. There is moderate calcification of the aortic valve. Aortic valve  regurgitation is not visualized. Mild aortic valve stenosis.  5. There is borderline dilatation of the ascending aorta, measuring 35 mm.  6. The inferior vena cava is normal in size with greater than 50% respiratory variability, suggesting right atrial pressure of 3 mmHg. Comparison(s): No prior Echocardiogram. FINDINGS  Left Ventricle: Windows are challenging to fully assess wall motion. Left ventricular ejection fraction, by estimation, is 45 to 50%. The left ventricle has mildly decreased function. The left ventricle demonstrates global hypokinesis. The left ventricular internal cavity size was normal in  size. There is no left ventricular hypertrophy. Left ventricular diastolic parameters are consistent with Grade I diastolic dysfunction (impaired relaxation). Right Ventricle: The right ventricular size is normal. Right vetricular wall thickness was not well visualized. Right ventricular systolic function is normal. There is normal pulmonary artery systolic pressure. The tricuspid regurgitant velocity is 2.12 m/s, and with an assumed right atrial pressure of 3 mmHg, the estimated right ventricular systolic pressure is 63.8 mmHg. Left Atrium: Left atrial size was normal in size. Right Atrium: Right atrial size was normal in size. Pericardium: There is no evidence of pericardial effusion. Mitral Valve: The mitral valve is grossly normal. No evidence of mitral valve regurgitation. Tricuspid Valve: The tricuspid valve is grossly normal. Tricuspid valve regurgitation is trivial. Aortic Valve: Planimetry AVA 1.2 cm2. There is moderate calcification of the aortic valve. Aortic valve regurgitation is not visualized. Mild aortic stenosis is present. Aortic valve mean gradient measures 17.3 mmHg. Aortic valve peak gradient measures 28.4 mmHg. Aortic valve area, by VTI measures 0.84 cm. Pulmonic Valve: The pulmonic valve was not well visualized. Pulmonic valve regurgitation is not visualized. Aorta: There is borderline  dilatation of the ascending aorta, measuring 35 mm. Venous: The inferior vena cava is normal in size with greater than 50% respiratory variability, suggesting right atrial pressure of 3 mmHg. IAS/Shunts: The interatrial septum was not well visualized.  LEFT VENTRICLE PLAX 2D LVIDd:         5.30 cm   Diastology LVIDs:         4.00 cm   LV e' medial:    4.65 cm/s LV PW:         0.70 cm   LV E/e' medial:  8.5 LV IVS:        0.80 cm   LV e' lateral:   7.05 cm/s LVOT diam:     2.20 cm   LV E/e' lateral: 5.6 LV SV:         42 LV SV Index:   23 LVOT Area:     3.80 cm  RIGHT VENTRICLE RV S prime:     11.80 cm/s TAPSE (M-mode): 2.1 cm LEFT ATRIUM             Index        RIGHT ATRIUM          Index LA diam:        3.90 cm 2.10 cm/m   RA Area:     9.57 cm LA Vol (A2C):   42.5 ml 22.88 ml/m  RA Volume:   19.00 ml 10.23 ml/m LA Vol (A4C):   50.0 ml 26.92 ml/m LA Biplane Vol: 49.4 ml 26.59 ml/m  AORTIC VALVE AV Area (Vmax):    0.92 cm AV Area (Vmean):   0.80 cm AV Area (VTI):     0.84 cm AV Vmax:           266.33 cm/s AV Vmean:          195.667 cm/s AV VTI:            0.504 m AV Peak Grad:      28.4 mmHg AV Mean Grad:      17.3 mmHg LVOT Vmax:         64.30 cm/s LVOT Vmean:        41.200 cm/s LVOT VTI:          0.111 m LVOT/AV VTI ratio: 0.22  AORTA Ao Root diam: 3.50 cm Ao Asc diam:  3.50 cm  MITRAL VALVE               TRICUSPID VALVE MV Area (PHT): 4.60 cm    TR Peak grad:   18.0 mmHg MV Decel Time: 165 msec    TR Vmax:        212.00 cm/s MV E velocity: 39.30 cm/s MV A velocity: 83.80 cm/s  SHUNTS MV E/A ratio:  0.47        Systemic VTI:  0.11 m                            Systemic Diam: 2.20 cm Justin Shaw Electronically signed by Justin Shaw Signature Date/Time: 10/18/2021/11:28:38 AM    Final    MR Cervical Spine Wo Contrast  Result Date: 10/18/2021 CLINICAL DATA:  87 year old male status post blunt trauma with cervical spinal ankylosis and suspicion of acute injury at the cervicothoracic junction by CT. EXAM:  MRI CERVICAL SPINE WITHOUT CONTRAST TECHNIQUE: Multiplanar, multisequence MR imaging of the cervical spine was performed. No intravenous contrast was administered. COMPARISON:  CT cervical spine 0115 hours today. Brain MRI 08/25/2021. FINDINGS: Alignment: Stable from the earlier CT. Straightening of cervical lordosis with 2-3 mm of anterolisthesis C7 on T1. Vertebrae: Ankylosis C2 through C5 (note right C4-C5 facet ankylosis) as demonstrated by CT. Degenerative appearing endplate changes C4 through C7. Subtle superior endplate compression at both T1 and T2 levels is not accompanied by convincing marrow edema. And no convincing acute osseous abnormality by MRI elsewhere. Background bone marrow signal is within normal limits. Cord: Limited spinal cord detail on axial images and sagittal stir due to motion artifact. No convincing visible cord signal abnormality. Capacious spinal canal at most levels. Posterior Fossa, vertebral arteries, paraspinal tissues: Chronic degenerative ligamentous hypertrophy about the odontoid. Cervicomedullary junction appears stable since June and otherwise negative. Brain is detailed separately today. Preserved major vascular flow voids in the neck. Mild patchy abnormal increased ligamentous STIR signal posteriorly C4-C5 through C6-C7 interspinous ligaments (series 7, image 19) and along the posterior left C6 facets (image 13). Associated trace abnormal prevertebral fluid or edema from C4 through T1 (series 7, image 8 and series 8, image 12). Furthermore, possible more generalized muscle edema in the lateral right longus coli on series 7, image 1. No other discrete muscle injury identified. Otherwise negative visible neck soft tissues, right lung apex. Disc levels: C2-C3:  Ankylosis. C3-C4:  Ankylosis. C4-C5: Moderate to severe facet hypertrophy with ankylosis on the right side demonstrated by CT, possible also lateral interbody ankylosis related to bulky endplate spurring here. Associated  severe bilateral C5 foraminal stenosis. C5-C6: Disc space loss with circumferential disc osteophyte complex and mild to moderate facet hypertrophy. Mild ligament flavum hypertrophy. Borderline to mild spinal stenosis. Moderate left and moderate to severe right C6 foraminal stenosis. C6-C7: Disc space loss with circumferential disc osteophyte complex. Moderate bilateral facet hypertrophy. Mild ligament flavum hypertrophy. Borderline to mild spinal stenosis. Moderate to severe bilateral C7 foraminal stenosis greater on the left. C7-T1: Conspicuous increased T2 and STIR signal in the posterior disc space here (series 7, image 8). Underlying chronic appearing anterolisthesis with moderate bilateral facet hypertrophy. No convincing epidural blood. No spinal stenosis. No significant foraminal stenosis. IMPRESSION: 1. Evidence of mild to moderate acute anterior and posterior cervical ligamentous injury. Difficult to exclude acute injury to the posterior C7-T1 disc. And possible acute muscle injury of the right lateral longus coli. No convincing epidural hemorrhage or spinal cord injury. 2.  Underlying cervical spine ankylosis C2 through C5 as demonstrated by CT. Mild superior endplate deformities of both T1 and T2 appear chronic. 3. Advanced cervical spine degeneration elsewhere. Moderate or severe neural foraminal stenosis from the bilateral C5 through C7 nerve levels. But only borderline to mild spinal stenosis at C5-C6 and C6-C7. Electronically Signed   By: Genevie Ann M.D.   On: 10/18/2021 05:14   MR BRAIN WO CONTRAST  Result Date: 10/18/2021 CLINICAL DATA:  87 year old male status post blunt trauma. Persistent dizziness. EXAM: MRI HEAD WITHOUT CONTRAST TECHNIQUE: Multiplanar, multiecho pulse sequences of the brain and surrounding structures were obtained without intravenous contrast. COMPARISON:  Head CT earlier today.  Brain MRI 08/25/2021. FINDINGS: Brain: No restricted diffusion to suggest acute infarction. No  midline shift, mass effect, evidence of mass lesion, ventriculomegaly, extra-axial collection or acute intracranial hemorrhage. Cervicomedullary junction and pituitary are within normal limits. Pearline Cables and white matter signal throughout the brain appears stable since June and largely normal for age. No convincing cortical encephalomalacia or chronic cerebral blood products. Mild T2 heterogeneity scattered in the deep gray matter and deep white matter capsules. Brainstem and cerebellum remain normal. Vascular: Major intracranial vascular flow voids are stable since June. There is a degree of generalized intracranial artery tortuosity. Skull and upper cervical spine: Dedicated cervical spine MRI reported separately. Evidence of upper cervical ankylosis with chronic degenerative ligamentous hypertrophy about the odontoid. Visualized bone marrow signal is within normal limits. Sinuses/Orbits: Stable, negative. Other: Mastoids remain clear. Right anterior superior convexity scalp hematoma, series 11, image 19. IMPRESSION: 1. No acute intracranial abnormality. Stable since June and largely normal for age noncontrast MRI appearance of the brain. 2. Right anterior convexity scalp hematoma. Dedicated Cervical Spine MRI reported separately. Electronically Signed   By: Genevie Ann M.D.   On: 10/18/2021 04:47   CT Cervical Spine Wo Contrast  Result Date: 10/18/2021 CLINICAL DATA:  Blunt facial trauma EXAM: CT CERVICAL SPINE WITHOUT CONTRAST TECHNIQUE: Multidetector CT imaging of the cervical spine was performed without intravenous contrast. Multiplanar CT image reconstructions were also generated. RADIATION DOSE REDUCTION: This exam was performed according to the departmental dose-optimization program which includes automated exposure control, adjustment of the mA and/or kV according to patient size and/or use of iterative reconstruction technique. COMPARISON:  None Available. FINDINGS: Alignment: Slight anterior subluxation of C7  on T1. Suggestion of mild cortical irregularity at the superior anterior endplate of C7. Changes may represent acute ligamentous injury. MRI suggested for further evaluation. Skull base and vertebrae: Skull base appears intact. Slight cortical irregularity at the superior anterior endplate of C7 as discussed. No additional vertebral compression deformities. No focal bone lesion or bone destruction. Soft tissues and spinal canal: No prevertebral soft tissue swelling is identified. No abnormal paraspinal soft tissue mass or infiltration. Disc levels: Degenerative changes with disc space narrowing and endplate osteophyte formation throughout. Degenerative changes in the posterior facet joints. Uncovertebral spurring causes some encroachment upon the neural foramina bilaterally. Upper chest: Negative. Other: None. IMPRESSION: 1. Slight anterior subluxation of C7 on T1 with suggestion of slight cortical compression of the anterior superior endplate of C7, possibly ligamentous injury. MRI suggested for further evaluation. 2. Degenerative changes throughout the cervical spine. Electronically Signed   By: Lucienne Capers M.D.   On: 10/18/2021 01:39   CT Head Wo Contrast  Result Date: 10/18/2021 CLINICAL DATA:  Syncope or presyncope with cerebrovascular cause suspected EXAM: CT HEAD WITHOUT CONTRAST TECHNIQUE: Contiguous axial images were obtained from the base of the  skull through the vertex without intravenous contrast. RADIATION DOSE REDUCTION: This exam was performed according to the departmental dose-optimization program which includes automated exposure control, adjustment of the mA and/or kV according to patient size and/or use of iterative reconstruction technique. COMPARISON:  CT head 08/24/2021.  MRI brain 08/25/2021 FINDINGS: Brain: No evidence of acute infarction, hemorrhage, hydrocephalus, extra-axial collection or mass lesion/mass effect. Mild cerebral atrophy. Vascular: No hyperdense vessel or unexpected  calcification. Skull: Normal. Negative for fracture or focal lesion. Sinuses/Orbits: No acute finding. Other: None. IMPRESSION: No acute intracranial abnormalities.  Mild cerebral atrophy. Electronically Signed   By: Lucienne Capers M.D.   On: 10/18/2021 01:36   DG Chest Portable 1 View  Result Date: 10/17/2021 CLINICAL DATA:  Status post fall. EXAM: PORTABLE CHEST 1 VIEW COMPARISON:  May 11, 2006 FINDINGS: The heart size and mediastinal contours are within normal limits. Both lungs are clear. The visualized skeletal structures are unremarkable. IMPRESSION: No active cardiopulmonary disease. Electronically Signed   By: Virgina Norfolk M.D.   On: 10/17/2021 23:10    Scheduled Meds:  enoxaparin (LOVENOX) injection  40 mg Subcutaneous Q24H   fluticasone  1 spray Each Nare Daily   pravastatin  40 mg Oral q1800   sodium chloride flush  3 mL Intravenous Q12H   Continuous Infusions:  lactated ringers Stopped (10/19/21 1032)     LOS: 0 days   Shelly Coss, MD [facility] P7/26/2023, 10:55 AM

## 2021-10-19 NOTE — Plan of Care (Signed)
  Problem: Activity: Goal: Risk for activity intolerance will decrease Outcome: Progressing   Problem: Safety: Goal: Ability to remain free from injury will improve Outcome: Progressing   

## 2021-10-19 NOTE — Progress Notes (Signed)
Physical Therapy Treatment Patient Details Name: Justin Shaw MRN: [id number] DOB: 22-Mar-1935 Today's Date: 10/19/2021   History of Present Illness 87 y.o. male presented to ED on 10/17/2021 after syncopal episode and fall, sustaining laceration to the head. hx of vertigo.   MRI with concerns for ligamentous injury/strain.  Neurosurgery recommends immobilization in an Aspen collar and OP f/u in their office.    PT Comments    Pt tolerates treatment well, demonstrating improvements in stability when mobilizing. Pt is able to ambulate for increased distances without the need for UE support, tolerating multiple dynamic gait challenges without loss of balance. Pt unable to perform vestibular assessment due to cervical ROM restrictions due to ligamentous injury and need for C-collar. Pt will benefit from continued dynamic balance training in an effort to reduce falls risk. PT recommends HHPT for balance training, with need for outpatient vestibular PT once cleared for discharge of C-collar and return to functional C-spine ROM.  Recommendations for follow up therapy are one component of a multi-disciplinary discharge planning process, led by the attending physician.  Recommendations may be updated based on patient status, additional functional criteria and insurance authorization.  Follow Up Recommendations  Home health PT (will benefit from outpatient vestibular assessment once cleared to D/C C-collar)     Assistance Recommended at Discharge Intermittent Supervision/Assistance  Patient can return home with the following A little help with walking and/or transfers;A little help with bathing/dressing/bathroom   Equipment Recommendations  None recommended by PT    Recommendations for Other Services       Precautions / Restrictions Precautions Precautions: Fall Required Braces or Orthoses: Cervical Brace Cervical Brace: Hard collar;At all times Restrictions Weight Bearing Restrictions: No      Mobility  Bed Mobility                    Transfers Overall transfer level: Needs assistance Equipment used: None Transfers: Sit to/from Stand Sit to Stand: Supervision                Ambulation/Gait Ambulation/Gait assistance: Supervision Gait Distance (Feet): 800 Feet Assistive device: None Gait Pattern/deviations: Step-through pattern, Drifts right/left Gait velocity: functional Gait velocity interpretation: >2.62 ft/sec, indicative of community ambulatory   General Gait Details: pt with increased lateral drift initially but this improves with further ambulation distances. Pt tolerates changes in gait speed, stride length, changes in direction, abrupt stops and turns without significant balance deviations   Stairs             Wheelchair Mobility    Modified Rankin (Stroke Patients Only)       Balance Overall balance assessment: Needs assistance Sitting-balance support: No upper extremity supported, Feet supported Sitting balance-Leahy Scale: Good     Standing balance support: No upper extremity supported, During functional activity Standing balance-Leahy Scale: Good   Single Leg Stance - Right Leg: 4 Single Leg Stance - Left Leg: 5     Rhomberg - Eyes Opened: 15 Rhomberg - Eyes Closed: 15 (pt stops at 15 seconds, opens eyes)                Cognition Arousal/Alertness: Awake/alert Behavior During Therapy: WFL for tasks assessed/performed Overall Cognitive Status: Within Functional Limits for tasks assessed                                          Exercises  General Comments General comments (skin integrity, edema, etc.): tachycardia into 120s with activity, pt denies symptoms      Pertinent Vitals/Pain Pain Assessment Pain Assessment: No/denies pain    Home Living                          Prior Function            PT Goals (current goals can now be found in the care plan section)  Acute Rehab PT Goals Patient Stated Goal: to return home Progress towards PT goals: Progressing toward goals    Frequency    Min 4X/week      PT Plan Current plan remains appropriate    Co-evaluation              AM-PAC PT "6 Clicks" Mobility   Outcome Measure  Help needed turning from your back to your side while in a flat bed without using bedrails?: A Little Help needed moving from lying on your back to sitting on the side of a flat bed without using bedrails?: A Little Help needed moving to and from a bed to a chair (including a wheelchair)?: A Little Help needed standing up from a chair using your arms (e.g., wheelchair or bedside chair)?: A Little Help needed to walk in hospital room?: A Little Help needed climbing 3-5 steps with a railing? : A Little 6 Click Score: 18    End of Session   Activity Tolerance: Patient tolerated treatment well Patient left: in chair;with call bell/phone within reach;with chair alarm set;with family/visitor present Nurse Communication: Mobility status PT Visit Diagnosis: Other abnormalities of gait and mobility (R26.89);History of falling (Z91.81);Dizziness and giddiness (R42)     Time: 9166-0600 PT Time Calculation (min) (ACUTE ONLY): 34 min  Charges:  $Gait Training: 8-22 mins $Neuromuscular Re-education: 8-22 mins                     Zenaida Niece, PT, DPT Acute Rehabilitation Office [phone]    Zenaida Niece 10/19/2021, 10:27 AM

## 2021-10-19 NOTE — Consult Note (Signed)
Cardiology Consultation:   Patient ID: Justin Shaw MRN: [id number]; DOB: [birth year]  Admit date: 10/17/2021 Date of Consult: 10/19/2021  Primary Care Provider: Crist Infante, MD [facility] Cardiologist: Elouise Munroe, MD  [facility] Electrophysiologist:  None    Patient Profile:   Justin Shaw is a 87 y.o. male with a hx of hyperlipidemia and hypertension who is being seen today for the evaluation of syncope and LV dysfunction at the request of Jamse Mead MD.  History of Present Illness:   [name] is a 87 y.o. male with a hx of hyperlipidemia and hypertension who is being seen today for the evaluation of syncope and LV dysfunction .  History is supplied by his daughter as well as the patient.  He tells me that for the past 2 days he has been having severe episodes of dizziness which he describes as vertigo with the room spinning.  He has had to sleep in a recliner for the last 2 nights because he cannot lie flat because the vertigo gets much worse.  Last night he decided to try to go and sleep in his bed and went to lay down and then apparently per his wife fell out of bed.  He does not remember anything about the event.  It is unclear as to whether he was actually unconscious.  He did hit his head on the bedside table sustaining a laceration requiring suturing.  He hurt his neck as well and is in a c-collar.  He has no history of syncope in the past except when he was playing football as a teenager.  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  MRI of the brain showed a scalp hematoma but no acute intracranial finding.  C-spine MRI showed acute ligamentous injury.  2D echo was done yesterday which showed EF 45 to 50% with global hypokinesis although windows were very challenging with poor endocardial border definition and mild aortic stenosis.  He denies any history of chest pain or pressure, dyspnea on exertion,  PND, orthopnea, lower extremity edema, palpitations.  He has had no other episodes of syncope.  Twelve-lead EKG from today personally reviewed showing normal sinus rhythm with nonspecific ST abnormality and normal QTc and PR intervals. Past Medical History:  Diagnosis Date   Adenomatous colon polyp    Anal fissure    Chest pain    Diverticulosis    Dyslipidemia    Hypertension     Past Surgical History:  Procedure Laterality Date   ANAL FISSURE REPAIR     COLONOSCOPY  02/23/2009   INGUINAL HERNIA REPAIR     TONSILLECTOMY     TRANSTHORACIC ECHOCARDIOGRAM  2007   NORMAL LV FUNCTION, AND MILD TRACE MITRAL AND TRICUSPID REGURGITATION     Home Medications:  Prior to Admission medications   Medication Sig Start Date End Date Taking? Authorizing Provider  ALPRAZolam Duanne Moron) 0.5 MG tablet Take 0.5-1 mg by mouth at bedtime as needed for anxiety or sleep. 06/08/21  Yes [provider]  benazepril (LOTENSIN) 10 MG tablet Take 10 mg by mouth daily.  09/30/15  Yes [provider]  Cholecalciferol (VITAMIN D3) 50 MCG (2000 UT) capsule Take 1 capsule by mouth daily.    Yes [provider]  fluticasone (FLONASE) 50 MCG/ACT nasal spray Place 1 spray into both nostrils daily.   Yes [provider]  LIVALO 2 MG TABS Take  1 tablet by mouth daily.  12/11/15  Yes [provider]  mupirocin ointment (BACTROBAN) 2 % Apply topically 3 (three) times daily. 10/11/21  Yes [provider]    Inpatient Medications: Scheduled Meds:  cholecalciferol  2,000 Units Oral Daily   enoxaparin (LOVENOX) injection  40 mg Subcutaneous Q24H   fluticasone  1 spray Each Nare Daily   pravastatin  40 mg Oral q1800   sodium chloride flush  3 mL Intravenous Q12H   Continuous Infusions:  lactated ringers Stopped (10/19/21 1032)   PRN Meds: acetaminophen **OR** acetaminophen, ALPRAZolam, hydrALAZINE, ondansetron **OR** ondansetron (ZOFRAN) IV  Allergies:    Allergies   Allergen Reactions   Crestor [Rosuvastatin Calcium] Other (See Comments)    Chest pain and leg pain and joint pain   Hydrocodone Nausea And Vomiting   Hydrocodone-Acetaminophen Nausea And Vomiting   Oxycodone-Acetaminophen Nausea And Vomiting    Social History:   Social History   Socioeconomic History   Marital status: Married    Spouse name: Not on file   Number of children: 3   Years of education: Not on file   Highest education level: Not on file  Occupational History   Occupation: pharmacist  Tobacco Use   Smoking status: Never   Smokeless tobacco: Never  Substance and Sexual Activity   Alcohol use: Yes    Comment: occasional wine   Drug use: No   Sexual activity: Not on file  Other Topics Concern   Not on file  Social History Narrative   Not on file   Social Determinants of Health   Financial Resource Strain: Not on file  Food Insecurity: Not on file  Transportation Needs: Not on file  Physical Activity: Not on file  Stress: Not on file  Social Connections: Not on file  Intimate Partner Violence: Not on file    Family History:    Family History  Problem Relation Age of Onset   Hypertension Mother    Diabetes Mother    Stroke Mother    Colon cancer Neg Hx      ROS:  Please see the history of present illness.   All other ROS reviewed and negative.     Physical Exam/Data:   Vitals:   10/18/21 2035 10/19/21 0121 10/19/21 0459 10/19/21 1045  BP: (!) 118/91 122/76 132/71 123/67  Pulse: 90 82 84   Resp: '18 18 18   '$ Temp: 98.1 F (36.7 C) 98.5 F (36.9 C) 98 F (36.7 C)   TempSrc: Oral Oral Oral   SpO2:  99% 99%   Weight:   68.8 kg   Height:        Intake/Output Summary (Last 24 hours) at 10/19/2021 1450 Last data filed at 10/19/2021 1353 Gross per 24 hour  Intake 1557.35 ml  Output 1450 ml  Net 107.35 ml      10/19/2021    4:59 AM 10/18/2021    3:27 PM 10/17/2021   10:43 PM  Last 3 Weights  Weight (lbs) 151 lb 9.6 oz 149 lb 7.6 oz 152  lb  Weight (kg) 68.765 kg 67.8 kg 68.947 kg     Body mass index is 21.75 kg/m.  General:  Well nourished, well developed, in no acute distress HEENT: normal Lymph: no adenopathy Neck: no JVD Endocrine:  No thryomegaly Vascular: No carotid bruits; FA pulses 2+ bilaterally without bruits  Cardiac:  normal S1, S2; RRR; no murmur  Lungs:  clear to auscultation bilaterally, no wheezing, rhonchi or rales  Abd: soft, nontender, no hepatomegaly  Ext: no edema Musculoskeletal:  No deformities, BUE and BLE strength normal and equal Skin: warm and dry  Neuro:  CNs 2-12 intact, no focal abnormalities noted Psych:  Normal affect   EKG:  The EKG was personally reviewed and demonstrates: Normal sinus rhythm with nonspecific ST abnormality and normal intervals Telemetry:  Telemetry was personally reviewed and demonstrates: Sinus rhythm  Relevant CV Studies: 2D echo 2021/10/30 IMPRESSIONS    1. Windows are challenging to fully assess wall motion. Left ventricular  ejection fraction, by estimation, is 45 to 50%. The left ventricle has  mildly decreased function. The left ventricle demonstrates global  hypokinesis. Left ventricular diastolic  parameters are consistent with Grade I diastolic dysfunction (impaired  relaxation).   2. Right ventricular systolic function is normal. The right ventricular  size is normal. There is normal pulmonary artery systolic pressure.   3. The mitral valve is grossly normal. No evidence of mitral valve  regurgitation.   4. Planimetry AVA 1.2 cm2. There is moderate calcification of the aortic  valve. Aortic valve regurgitation is not visualized. Mild aortic valve  stenosis.   5. There is borderline dilatation of the ascending aorta, measuring 35  mm.   6. The inferior vena cava is normal in size with greater than 50%  respiratory variability, suggesting right atrial pressure of 3 mmHg.   Laboratory Data:  High Sensitivity Troponin:   Recent Labs  Lab  10/17/21 2245 10/18/21 0126  TROPONINIHS 12 14     Chemistry Recent Labs  Lab 10/17/21 2245 2021-10-30 0149  NA 137 137  K 4.7 4.8  CL 106 106  CO2 23 24  GLUCOSE 109* 119*  BUN 31* 25*  CREATININE 1.74* 1.68*  CALCIUM 9.0 8.8*  GFRNONAA 38* 40*  ANIONGAP 8 7    No results for input(s): "PROT", "ALBUMIN", "AST", "ALT", "ALKPHOS", "BILITOT" in the last 168 hours. Hematology Recent Labs  Lab 10/17/21 2245 October 30, 2021 0149  WBC 11.4* 9.5  RBC 3.93* 3.61*  HGB 12.4* 11.6*  HCT 38.5* 34.8*  MCV 98.0 96.4  MCH 31.6 32.1  MCHC 32.2 33.3  RDW 11.9 11.9  PLT 217 196   BNPNo results for input(s): "BNP", "PROBNP" in the last 168 hours.  DDimer No results for input(s): "DDIMER" in the last 168 hours.   Radiology/Studies:  US RENAL  Result Date: 10/30/21 CLINICAL DATA:  Acute kidney injury, history hypertension EXAM: RENAL / URINARY TRACT ULTRASOUND COMPLETE COMPARISON:  01/03/2017 FINDINGS: Right Kidney: Renal measurements: 10.3 x 4.8 x 5.5 cm = volume: 144 mL. Cortical thinning. Mildly increased cortical echogenicity. Large mid RIGHT renal cyst 4.6 x 4.5 x 4.6 cm, previously 5.2 x 4.9 x 4.7 cm. Additional cyst at upper pole 3.6 x 3.3 x 3.8 cm, previously 3.3 x 2.7 x 3.1 cm. Smaller superior cyst contains a single tiny peripheral septation. The larger mid cyst shows a single partial septation at midportion. No definite mural nodularity. No shadowing calculi or solid masses. No hydronephrosis. Left Kidney: Renal measurements: 8.9 x 5.4 x 5.9 cm = volume: 146 mL. Cortical thinning. Increased cortical echogenicity. No mass, hydronephrosis, or shadowing calcification. Bladder: Appears normal for degree of bladder distention. Other: N/A IMPRESSION: Cortical atrophy and medical renal disease changes of both kidneys. Two minimally complicated cysts within the RIGHT kidney, 1 slightly increased in size and 1 slightly decreased in size since previous exam. No additional renal sonographic  abnormalities. Electronically Signed   By: Crist Infante.D.  On: 10/19/2021 13:15   ECHOCARDIOGRAM COMPLETE  Result Date: 10/18/2021    ECHOCARDIOGRAM REPORT   Patient Name:   Justin Shaw Date of Exam: 10/18/2021 Medical Rec #:  [id number]        Height:       70.0 in Accession #:    [id number]       Weight:       152.0 lb Date of Birth:  Dec 14, 1934        BSA:          1.858 m Patient Age:    46 years         BP:           122/87 mmHg Patient Gender: M                HR:           96 bpm. Exam Location:  Inpatient Procedure: 2D Echo, Cardiac Doppler and Color Doppler Indications:    Syncope R55  History:        Patient has no prior history of Echocardiogram examinations.                 Signs/Symptoms:Chest Pain; Risk Factors:Hypertension and                 Dyslipidemia.  Sonographer:    Bernadene Person RDCS Referring Phys: [id number] JENNIFER YATES  Sonographer Comments: Image acquisition challenging due to respiratory motion. IMPRESSIONS  1. Windows are challenging to fully assess wall motion. Left ventricular ejection fraction, by estimation, is 45 to 50%. The left ventricle has mildly decreased function. The left ventricle demonstrates global hypokinesis. Left ventricular diastolic parameters are consistent with Grade I diastolic dysfunction (impaired relaxation).  2. Right ventricular systolic function is normal. The right ventricular size is normal. There is normal pulmonary artery systolic pressure.  3. The mitral valve is grossly normal. No evidence of mitral valve regurgitation.  4. Planimetry AVA 1.2 cm2. There is moderate calcification of the aortic valve. Aortic valve regurgitation is not visualized. Mild aortic valve stenosis.  5. There is borderline dilatation of the ascending aorta, measuring 35 mm.  6. The inferior vena cava is normal in size with greater than 50% respiratory variability, suggesting right atrial pressure of 3 mmHg. Comparison(s): No prior Echocardiogram. FINDINGS  Left Ventricle:  Windows are challenging to fully assess wall motion. Left ventricular ejection fraction, by estimation, is 45 to 50%. The left ventricle has mildly decreased function. The left ventricle demonstrates global hypokinesis. The left ventricular internal cavity size was normal in size. There is no left ventricular hypertrophy. Left ventricular diastolic parameters are consistent with Grade I diastolic dysfunction (impaired relaxation). Right Ventricle: The right ventricular size is normal. Right vetricular wall thickness was not well visualized. Right ventricular systolic function is normal. There is normal pulmonary artery systolic pressure. The tricuspid regurgitant velocity is 2.12 m/s, and with an assumed right atrial pressure of 3 mmHg, the estimated right ventricular systolic pressure is 50.5 mmHg. Left Atrium: Left atrial size was normal in size. Right Atrium: Right atrial size was normal in size. Pericardium: There is no evidence of pericardial effusion. Mitral Valve: The mitral valve is grossly normal. No evidence of mitral valve regurgitation. Tricuspid Valve: The tricuspid valve is grossly normal. Tricuspid valve regurgitation is trivial. Aortic Valve: Planimetry AVA 1.2 cm2. There is moderate calcification of the aortic valve. Aortic valve regurgitation is not visualized. Mild aortic stenosis is present. Aortic valve mean gradient measures 17.3  mmHg. Aortic valve peak gradient measures 28.4 mmHg. Aortic valve area, by VTI measures 0.84 cm. Pulmonic Valve: The pulmonic valve was not well visualized. Pulmonic valve regurgitation is not visualized. Aorta: There is borderline dilatation of the ascending aorta, measuring 35 mm. Venous: The inferior vena cava is normal in size with greater than 50% respiratory variability, suggesting right atrial pressure of 3 mmHg. IAS/Shunts: The interatrial septum was not well visualized.  LEFT VENTRICLE PLAX 2D LVIDd:         5.30 cm   Diastology LVIDs:         4.00 cm   LV e'  medial:    4.65 cm/s LV PW:         0.70 cm   LV E/e' medial:  8.5 LV IVS:        0.80 cm   LV e' lateral:   7.05 cm/s LVOT diam:     2.20 cm   LV E/e' lateral: 5.6 LV SV:         42 LV SV Index:   23 LVOT Area:     3.80 cm  RIGHT VENTRICLE RV S prime:     11.80 cm/s TAPSE (M-mode): 2.1 cm LEFT ATRIUM             Index        RIGHT ATRIUM          Index LA diam:        3.90 cm 2.10 cm/m   RA Area:     9.57 cm LA Vol (A2C):   42.5 ml 22.88 ml/m  RA Volume:   19.00 ml 10.23 ml/m LA Vol (A4C):   50.0 ml 26.92 ml/m LA Biplane Vol: 49.4 ml 26.59 ml/m  AORTIC VALVE AV Area (Vmax):    0.92 cm AV Area (Vmean):   0.80 cm AV Area (VTI):     0.84 cm AV Vmax:           266.33 cm/s AV Vmean:          195.667 cm/s AV VTI:            0.504 m AV Peak Grad:      28.4 mmHg AV Mean Grad:      17.3 mmHg LVOT Vmax:         64.30 cm/s LVOT Vmean:        41.200 cm/s LVOT VTI:          0.111 m LVOT/AV VTI ratio: 0.22  AORTA Ao Root diam: 3.50 cm Ao Asc diam:  3.50 cm MITRAL VALVE               TRICUSPID VALVE MV Area (PHT): 4.60 cm    TR Peak grad:   18.0 mmHg MV Decel Time: 165 msec    TR Vmax:        212.00 cm/s MV E velocity: 39.30 cm/s MV A velocity: 83.80 cm/s  SHUNTS MV E/A ratio:  0.47        Systemic VTI:  0.11 m                            Systemic Diam: 2.20 cm Phineas Inches Electronically signed by Phineas Inches Signature Date/Time: 10/18/2021/11:28:38 AM    Final    MR Cervical Spine Wo Contrast  Result Date: 10/18/2021 CLINICAL DATA:  87 year old male status post blunt trauma with cervical spinal ankylosis and suspicion of acute injury at the  cervicothoracic junction by CT. EXAM: MRI CERVICAL SPINE WITHOUT CONTRAST TECHNIQUE: Multiplanar, multisequence MR imaging of the cervical spine was performed. No intravenous contrast was administered. COMPARISON:  CT cervical spine 0115 hours today. Brain MRI 08/25/2021. FINDINGS: Alignment: Stable from the earlier CT. Straightening of cervical lordosis with 2-3 mm of  anterolisthesis C7 on T1. Vertebrae: Ankylosis C2 through C5 (note right C4-C5 facet ankylosis) as demonstrated by CT. Degenerative appearing endplate changes C4 through C7. Subtle superior endplate compression at both T1 and T2 levels is not accompanied by convincing marrow edema. And no convincing acute osseous abnormality by MRI elsewhere. Background bone marrow signal is within normal limits. Cord: Limited spinal cord detail on axial images and sagittal stir due to motion artifact. No convincing visible cord signal abnormality. Capacious spinal canal at most levels. Posterior Fossa, vertebral arteries, paraspinal tissues: Chronic degenerative ligamentous hypertrophy about the odontoid. Cervicomedullary junction appears stable since June and otherwise negative. Brain is detailed separately today. Preserved major vascular flow voids in the neck. Mild patchy abnormal increased ligamentous STIR signal posteriorly C4-C5 through C6-C7 interspinous ligaments (series 7, image 19) and along the posterior left C6 facets (image 13). Associated trace abnormal prevertebral fluid or edema from C4 through T1 (series 7, image 8 and series 8, image 12). Furthermore, possible more generalized muscle edema in the lateral right longus coli on series 7, image 1. No other discrete muscle injury identified. Otherwise negative visible neck soft tissues, right lung apex. Disc levels: C2-C3:  Ankylosis. C3-C4:  Ankylosis. C4-C5: Moderate to severe facet hypertrophy with ankylosis on the right side demonstrated by CT, possible also lateral interbody ankylosis related to bulky endplate spurring here. Associated severe bilateral C5 foraminal stenosis. C5-C6: Disc space loss with circumferential disc osteophyte complex and mild to moderate facet hypertrophy. Mild ligament flavum hypertrophy. Borderline to mild spinal stenosis. Moderate left and moderate to severe right C6 foraminal stenosis. C6-C7: Disc space loss with circumferential disc  osteophyte complex. Moderate bilateral facet hypertrophy. Mild ligament flavum hypertrophy. Borderline to mild spinal stenosis. Moderate to severe bilateral C7 foraminal stenosis greater on the left. C7-T1: Conspicuous increased T2 and STIR signal in the posterior disc space here (series 7, image 8). Underlying chronic appearing anterolisthesis with moderate bilateral facet hypertrophy. No convincing epidural blood. No spinal stenosis. No significant foraminal stenosis. IMPRESSION: 1. Evidence of mild to moderate acute anterior and posterior cervical ligamentous injury. Difficult to exclude acute injury to the posterior C7-T1 disc. And possible acute muscle injury of the right lateral longus coli. No convincing epidural hemorrhage or spinal cord injury. 2. Underlying cervical spine ankylosis C2 through C5 as demonstrated by CT. Mild superior endplate deformities of both T1 and T2 appear chronic. 3. Advanced cervical spine degeneration elsewhere. Moderate or severe neural foraminal stenosis from the bilateral C5 through C7 nerve levels. But only borderline to mild spinal stenosis at C5-C6 and C6-C7. Electronically Signed   By: Genevie Ann M.D.   On: 10/18/2021 05:14   MR BRAIN WO CONTRAST  Result Date: 10/18/2021 CLINICAL DATA:  87 year old male status post blunt trauma. Persistent dizziness. EXAM: MRI HEAD WITHOUT CONTRAST TECHNIQUE: Multiplanar, multiecho pulse sequences of the brain and surrounding structures were obtained without intravenous contrast. COMPARISON:  Head CT earlier today.  Brain MRI 08/25/2021. FINDINGS: Brain: No restricted diffusion to suggest acute infarction. No midline shift, mass effect, evidence of mass lesion, ventriculomegaly, extra-axial collection or acute intracranial hemorrhage. Cervicomedullary junction and pituitary are within normal limits. Pearline Cables and white matter signal throughout the  brain appears stable since June and largely normal for age. No convincing cortical encephalomalacia  or chronic cerebral blood products. Mild T2 heterogeneity scattered in the deep gray matter and deep white matter capsules. Brainstem and cerebellum remain normal. Vascular: Major intracranial vascular flow voids are stable since June. There is a degree of generalized intracranial artery tortuosity. Skull and upper cervical spine: Dedicated cervical spine MRI reported separately. Evidence of upper cervical ankylosis with chronic degenerative ligamentous hypertrophy about the odontoid. Visualized bone marrow signal is within normal limits. Sinuses/Orbits: Stable, negative. Other: Mastoids remain clear. Right anterior superior convexity scalp hematoma, series 11, image 19. IMPRESSION: 1. No acute intracranial abnormality. Stable since June and largely normal for age noncontrast MRI appearance of the brain. 2. Right anterior convexity scalp hematoma. Dedicated Cervical Spine MRI reported separately. Electronically Signed   By: Genevie Ann M.D.   On: 10/18/2021 04:47   CT Cervical Spine Wo Contrast  Result Date: 10/18/2021 CLINICAL DATA:  Blunt facial trauma EXAM: CT CERVICAL SPINE WITHOUT CONTRAST TECHNIQUE: Multidetector CT imaging of the cervical spine was performed without intravenous contrast. Multiplanar CT image reconstructions were also generated. RADIATION DOSE REDUCTION: This exam was performed according to the departmental dose-optimization program which includes automated exposure control, adjustment of the mA and/or kV according to patient size and/or use of iterative reconstruction technique. COMPARISON:  None Available. FINDINGS: Alignment: Slight anterior subluxation of C7 on T1. Suggestion of mild cortical irregularity at the superior anterior endplate of C7. Changes may represent acute ligamentous injury. MRI suggested for further evaluation. Skull base and vertebrae: Skull base appears intact. Slight cortical irregularity at the superior anterior endplate of C7 as discussed. No additional vertebral  compression deformities. No focal bone lesion or bone destruction. Soft tissues and spinal canal: No prevertebral soft tissue swelling is identified. No abnormal paraspinal soft tissue mass or infiltration. Disc levels: Degenerative changes with disc space narrowing and endplate osteophyte formation throughout. Degenerative changes in the posterior facet joints. Uncovertebral spurring causes some encroachment upon the neural foramina bilaterally. Upper chest: Negative. Other: None. IMPRESSION: 1. Slight anterior subluxation of C7 on T1 with suggestion of slight cortical compression of the anterior superior endplate of C7, possibly ligamentous injury. MRI suggested for further evaluation. 2. Degenerative changes throughout the cervical spine. Electronically Signed   By: Lucienne Capers M.D.   On: 10/18/2021 01:39   CT Head Wo Contrast  Result Date: 10/18/2021 CLINICAL DATA:  Syncope or presyncope with cerebrovascular cause suspected EXAM: CT HEAD WITHOUT CONTRAST TECHNIQUE: Contiguous axial images were obtained from the base of the skull through the vertex without intravenous contrast. RADIATION DOSE REDUCTION: This exam was performed according to the departmental dose-optimization program which includes automated exposure control, adjustment of the mA and/or kV according to patient size and/or use of iterative reconstruction technique. COMPARISON:  CT head 08/24/2021.  MRI brain 08/25/2021 FINDINGS: Brain: No evidence of acute infarction, hemorrhage, hydrocephalus, extra-axial collection or mass lesion/mass effect. Mild cerebral atrophy. Vascular: No hyperdense vessel or unexpected calcification. Skull: Normal. Negative for fracture or focal lesion. Sinuses/Orbits: No acute finding. Other: None. IMPRESSION: No acute intracranial abnormalities.  Mild cerebral atrophy. Electronically Signed   By: Lucienne Capers M.D.   On: 10/18/2021 01:36   DG Chest Portable 1 View  Result Date: 10/17/2021 CLINICAL DATA:   Status post fall. EXAM: PORTABLE CHEST 1 VIEW COMPARISON:  May 11, 2006 FINDINGS: The heart size and mediastinal contours are within normal limits. Both lungs are clear. The visualized skeletal structures  are unremarkable. IMPRESSION: No active cardiopulmonary disease. Electronically Signed   By: Virgina Norfolk M.D.   On: 10/17/2021 23:10     Assessment and Plan:   Syncope -Unclear etiology with sustained ligamentous injury in his neck as well as a head laceration -He had been having severe vertigo for 2 days.  It is unclear to me as if the patient rolled over her in bed and was on the edge of the bed and just rolled off the bed hitting his head and then had syncope he had syncope prior to falling out of bed.  If that is the case the need to consider a primary arrhythmic event.  His daughter says that she does not know if he passed out and the patient has no idea because he does not remember the event. -Ultimately would recommend a 30-day event monitor to rule out arrhythmias. -Orthostatic blood pressures are normal -2D echo shows possible mild LV dysfunction but study was poor quality -Repeat limited 2D echo with Definity to define endocardial borders and get a better assessment of EF  2.  Mild LV dysfunction -See above >> this was a poor study with poor endocardial border definition and therefore we will repeat limited study with Definity   For questions or updates, please contact [facility] Please consult [email or web address] for contact info under    Signed, Fransico Him, MD  10/19/2021 2:50 PM

## 2021-10-20 ENCOUNTER — Observation Stay (HOSPITAL_COMMUNITY): Payer: Medicare Other

## 2021-10-20 ENCOUNTER — Other Ambulatory Visit: Payer: Self-pay | Admitting: Cardiology

## 2021-10-20 DIAGNOSIS — R55 Syncope and collapse: Secondary | ICD-10-CM | POA: Diagnosis not present

## 2021-10-20 DIAGNOSIS — I350 Nonrheumatic aortic (valve) stenosis: Secondary | ICD-10-CM | POA: Diagnosis not present

## 2021-10-20 DIAGNOSIS — N179 Acute kidney failure, unspecified: Secondary | ICD-10-CM

## 2021-10-20 DIAGNOSIS — I10 Essential (primary) hypertension: Secondary | ICD-10-CM | POA: Diagnosis not present

## 2021-10-20 LAB — BASIC METABOLIC PANEL
Anion gap: 5 (ref 5–15)
BUN: 31 mg/dL — ABNORMAL HIGH (ref 8–23)
CO2: 27 mmol/L (ref 22–32)
Calcium: 8.8 mg/dL — ABNORMAL LOW (ref 8.9–10.3)
Chloride: 104 mmol/L (ref 98–111)
Creatinine, Ser: 1.74 mg/dL — ABNORMAL HIGH (ref 0.61–1.24)
GFR, Estimated: 38 mL/min — ABNORMAL LOW (ref >60)
Glucose, Bld: 111 mg/dL — ABNORMAL HIGH (ref 70–99)
Potassium: 4.8 mmol/L (ref 3.5–5.1)
Sodium: 136 mmol/L (ref 135–145)

## 2021-10-20 LAB — SARS CORONAVIRUS 2 BY RT PCR: SARS Coronavirus 2 by RT PCR: NEGATIVE

## 2021-10-20 MED ORDER — GUAIFENESIN-DM 100-10 MG/5ML PO SYRP: 10.0000 mL | ORAL_SOLUTION | ORAL | 0 refills | Status: DC | PRN

## 2021-10-20 MED ORDER — METOPROLOL SUCCINATE ER 25 MG PO TB24: 25.0000 mg | ORAL_TABLET | Freq: Every day | ORAL | 1 refills | Status: DC

## 2021-10-20 MED ORDER — GUAIFENESIN-DM 100-10 MG/5ML PO SYRP
10.0000 mL | ORAL_SOLUTION | ORAL | Status: DC | PRN
  Administered 2021-10-20: 10 mL via ORAL
  Filled 2021-10-20: qty 10

## 2021-10-20 MED ORDER — METOPROLOL SUCCINATE ER 25 MG PO TB24
25.0000 mg | ORAL_TABLET | Freq: Every day | ORAL | Status: DC
  Administered 2021-10-20: 25 mg via ORAL
  Filled 2021-10-20: qty 1

## 2021-10-20 NOTE — Discharge Summary (Signed)
Physician Discharge Summary  [name] ERX:[id number] DOB: 1934/10/22 DOA: 10/17/2021  PCP: Crist Infante, MD  Admit date: 10/17/2021 Discharge date: 10/20/2021  Admitted From: Home Disposition:  Home  Discharge Condition:Stable CODE STATUS:DNR Diet recommendation: Heart Healthy  Brief/Interim Summary:  Patient is a 87 year old male with history of hypertension, hyperlipidemia presented to the emergency department with complaint of neck pain after he fell out of bed at home.  Patient is independent, fully functional, ambulatory at baseline.  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  MRI cervical spine done here showed evidence of acute ligamentous injury.  No neurodeficits.  Neurosurgery recommended c-collar and outpatient follow-up.  Echocardiogram done as a part of syncopal episode showed slightly diminished  ejection fraction, cardiology consulted.  Started on metoprolol.  Outpatient follow-up recommended by cardiology.  Hemodynamically stable for discharge home today with home health   Following problems were addressed during this hospitalization:  Syncope/fall/neck injury: Reported 2 days history of vertigo which has resolved now.  He fell out of the bed and hurt his neck.  MRI of the cervical spine showed mild to moderate acute anterior and posterior cervical ligamentous injury.  Neurosurgery was consulted who recommended outpatient follow-up.  Continue c-collar.  Pain is well controlled.  PT recommended home health on discharge.   Systolic congestive heart failure: Clinically he appears euvolemic, no signs of heart failure.  Echo showed EF of 50%, moderate aortic stenosis.  Cardiology consulted, started on metoprolol, recommend outpatient follow-up.  Also in persistent mild sinus tachycardia, TSH normal, continue metoprolol   CKD stage 3b: No previous lab work found.  Takes benazepril at home was on hold.  Currently creatinine in the range of 1.6-1.7.   Currently appears euvolemic.  No  hydronephrosis as per renal ultrasound but showed chronic medical renal disease .  Will recommend to follow-up with nephrology, Dr. Johnney Ou as an outpatient in 2 weeks  hypertension: Takes benazepril at home.  Currently normotensive.  Currently benazepril on hold.  We will likely discontinue this medication due to renal dysfunction   Hyperlipidemia: On pravastatin   Dysphagia: Speech therapy saw the patient and cleared for solid diet  Cough: Nonproductive cough.  Chest x-ray showed possible atelectasis.  Continue incentive spirometer.  Continue Robitussin  Discharge Diagnoses:  Principal Problem:   Syncope Active Problems:   Hyperlipidemia   Acute sprain of ligament of neck   Renal dysfunction   Essential hypertension   Dysphagia   DNR (do not resuscitate)   AKI (acute kidney injury) ([place])   Nonrheumatic aortic valve stenosis    Discharge Instructions  Discharge Instructions     Diet - low sodium heart healthy   Complete by: As directed    Discharge instructions   Complete by: As directed    1)Take prescribed medications as instructed 2)Follow up with neurosurgery, Dr. Annette Stable, in 2 weeks.  Name and number the provider has been attached.  Call for appointment 3)You will be called by cardiology for the follow-up appointment 4)Follow up with your PCP in a week,remove sutures on your forehead during the follow up   Increase activity slowly   Complete by: As directed    No wound care   Complete by: As directed       Allergies as of 10/20/2021       Reactions   Crestor [rosuvastatin Calcium] Other (See Comments)   Chest pain and leg pain and joint pain   Hydrocodone Nausea And Vomiting   Hydrocodone-acetaminophen Nausea And Vomiting  Oxycodone-acetaminophen Nausea And Vomiting        Medication List     STOP taking these medications    benazepril 10 MG tablet Commonly known as: LOTENSIN       TAKE these medications    ALPRAZolam 0.5 MG tablet Commonly  known as: XANAX Take 0.5-1 mg by mouth at bedtime as needed for anxiety or sleep.   fluticasone 50 MCG/ACT nasal spray Commonly known as: FLONASE Place 1 spray into both nostrils daily.   guaiFENesin-dextromethorphan 100-10 MG/5ML syrup Commonly known as: ROBITUSSIN DM Take 10 mLs by mouth every 4 (four) hours as needed for cough.   Livalo 2 MG Tabs Generic drug: Pitavastatin Calcium Take 1 tablet by mouth daily.   metoprolol succinate 25 MG 24 hr tablet Commonly known as: TOPROL-XL Take 1 tablet (25 mg total) by mouth daily. Start taking on: October 21, 2021   mupirocin ointment 2 % Commonly known as: BACTROBAN Apply topically 3 (three) times daily.   Vitamin D3 50 MCG (2000 UT) capsule Take 1 capsule by mouth daily.               Durable Medical Equipment  (From admission, onward)           Start     Ordered   10/18/21 1635  For home use only DME Walker rolling  Once       Question Answer Comment  Walker: With 5 Inch Wheels   Patient needs a walker to treat with the following condition Weakness      10/18/21 1634            Follow-up Information     [facility] Follow up.   Specialty: Home Health Services Why: [place], [place] will contact you for apt times Contact information: [place] [address] [phone]         Crist Infante, MD. Schedule an appointment as soon as possible for a visit in 1 week(s).   Specialty: Internal Medicine Contact information: [place] [address] [phone]         Sueanne Margarita, MD .   Specialty: Cardiology Contact information: [phone] [address] [place] [address] [phone]         Earnie Larsson, MD. Schedule an appointment as soon as possible for a visit in 2 week(s).   Specialty: Neurosurgery Contact information: [address] [place] [address] [phone]                Allergies   Allergen Reactions   Crestor [Rosuvastatin Calcium] Other (See Comments)    Chest pain and leg pain and joint pain   Hydrocodone Nausea And Vomiting   Hydrocodone-Acetaminophen Nausea And Vomiting   Oxycodone-Acetaminophen Nausea And Vomiting    Consultations: Cardiology, neurosurgery   Procedures/Studies: DG CHEST PORT 1 VIEW  Result Date: 10/20/2021 CLINICAL DATA:  Recent fall EXAM: PORTABLE CHEST 1 VIEW COMPARISON:  10/17/2021 FINDINGS: Numerous leads and wires project over the chest. Support apparatus projects over the upper lungs. Midline trachea. Normal heart size. No pleural effusion or pneumothorax. Diminished lung volumes. Subtle bibasilar airspace disease. IMPRESSION: Developing subtle bibasilar airspace disease in the setting of low lung volumes. Favor atelectasis. Early infection or aspiration could look similar. Support apparatus artifact degradation. Electronically Signed   By: Abigail Miyamoto M.D.   On: 10/20/2021 09:58   ECHOCARDIOGRAM LIMITED  Result Date: 10/19/2021    ECHOCARDIOGRAM LIMITED  REPORT   Patient Name:   Justin Shaw Date of Exam: 10/19/2021 Medical Rec #:  [id number]        Height:       70.0 in Accession #:    [id number]       Weight:       151.6 lb Date of Birth:  05-Oct-1934        BSA:          1.855 m Patient Age:    29 years         BP:           123/67 mmHg Patient Gender: M                HR:           72 bpm. Exam Location:  Inpatient Procedure: Limited Echo and Intracardiac Opacification Agent Indications:    Dilated cardiomyopathy  History:        Patient has prior history of Echocardiogram examinations, most                 recent 10/18/2021. Signs/Symptoms:Syncope; Risk                 Factors:Hypertension and Dyslipidemia.  Sonographer:    Merrie Roof RDCS Referring Phys: Eber Hong TURNER IMPRESSIONS  1. Left ventricular ejection fraction, by estimation, is 50%. Left ventricular ejection fraction by 2D MOD biplane is 50.4 %. The left ventricle has low  normal function. FINDINGS  Left Ventricle: Left ventricular ejection fraction, by estimation, is 50%. Left ventricular ejection fraction by 2D MOD biplane is 50.4 %. The left ventricle has low normal function. Definity contrast agent was given IV to delineate the left ventricular  endocardial borders.  LV Volumes (MOD)               Biplane EF (MOD) LV vol d, MOD    101.7 ml      LV Biplane EF:   Left A2C:                                            ventricular LV vol d, MOD    113.5 ml                       ejection A4C:                                            fraction by LV vol s, MOD    42.4 ml                        2D MOD A2C:                                            biplane is LV vol s, MOD    61.0 ml                        50.4 %. A4C: LV SV MOD A2C:   59.3 ml LV SV MOD A4C:   113.5 ml LV SV MOD BP:  54.5 ml Cherlynn Kaiser MD Electronically signed by Cherlynn Kaiser MD Signature Date/Time: 10/19/2021/7:19:57 PM    Final    US RENAL  Result Date: 10/19/2021 CLINICAL DATA:  Acute kidney injury, history hypertension EXAM: RENAL / URINARY TRACT ULTRASOUND COMPLETE COMPARISON:  01/03/2017 FINDINGS: Right Kidney: Renal measurements: 10.3 x 4.8 x 5.5 cm = volume: 144 mL. Cortical thinning. Mildly increased cortical echogenicity. Large mid RIGHT renal cyst 4.6 x 4.5 x 4.6 cm, previously 5.2 x 4.9 x 4.7 cm. Additional cyst at upper pole 3.6 x 3.3 x 3.8 cm, previously 3.3 x 2.7 x 3.1 cm. Smaller superior cyst contains a single tiny peripheral septation. The larger mid cyst shows a single partial septation at midportion. No definite mural nodularity. No shadowing calculi or solid masses. No hydronephrosis. Left Kidney: Renal measurements: 8.9 x 5.4 x 5.9 cm = volume: 146 mL. Cortical thinning. Increased cortical echogenicity. No mass, hydronephrosis, or shadowing calcification. Bladder: Appears normal for degree of bladder distention. Other: N/A IMPRESSION: Cortical atrophy and medical renal disease changes of  both kidneys. Two minimally complicated cysts within the RIGHT kidney, 1 slightly increased in size and 1 slightly decreased in size since previous exam. No additional renal sonographic abnormalities. Electronically Signed   By: Lavonia Dana M.D.   On: 10/19/2021 13:15   ECHOCARDIOGRAM COMPLETE  Result Date: 10/18/2021    ECHOCARDIOGRAM REPORT   Patient Name:   EVREN SHANKLAND Date of Exam: 10/18/2021 Medical Rec #:  [id number]        Height:       70.0 in Accession #:    [id number]       Weight:       152.0 lb Date of Birth:  02-25-1935        BSA:          1.858 m Patient Age:    11 years         BP:           122/87 mmHg Patient Gender: M                HR:           96 bpm. Exam Location:  Inpatient Procedure: 2D Echo, Cardiac Doppler and Color Doppler Indications:    Syncope R55  History:        Patient has no prior history of Echocardiogram examinations.                 Signs/Symptoms:Chest Pain; Risk Factors:Hypertension and                 Dyslipidemia.  Sonographer:    Bernadene Person RDCS Referring Phys: [id number] JENNIFER YATES  Sonographer Comments: Image acquisition challenging due to respiratory motion. IMPRESSIONS  1. Windows are challenging to fully assess wall motion. Left ventricular ejection fraction, by estimation, is 45 to 50%. The left ventricle has mildly decreased function. The left ventricle demonstrates global hypokinesis. Left ventricular diastolic parameters are consistent with Grade I diastolic dysfunction (impaired relaxation).  2. Right ventricular systolic function is normal. The right ventricular size is normal. There is normal pulmonary artery systolic pressure.  3. The mitral valve is grossly normal. No evidence of mitral valve regurgitation.  4. Planimetry AVA 1.2 cm2. There is moderate calcification of the aortic valve. Aortic valve regurgitation is not visualized. Mild aortic valve stenosis.  5. There is borderline dilatation of the ascending aorta, measuring 35 mm.  6. The inferior  vena cava is normal in size  with greater than 50% respiratory variability, suggesting right atrial pressure of 3 mmHg. Comparison(s): No prior Echocardiogram. FINDINGS  Left Ventricle: Windows are challenging to fully assess wall motion. Left ventricular ejection fraction, by estimation, is 45 to 50%. The left ventricle has mildly decreased function. The left ventricle demonstrates global hypokinesis. The left ventricular internal cavity size was normal in size. There is no left ventricular hypertrophy. Left ventricular diastolic parameters are consistent with Grade I diastolic dysfunction (impaired relaxation). Right Ventricle: The right ventricular size is normal. Right vetricular wall thickness was not well visualized. Right ventricular systolic function is normal. There is normal pulmonary artery systolic pressure. The tricuspid regurgitant velocity is 2.12 m/s, and with an assumed right atrial pressure of 3 mmHg, the estimated right ventricular systolic pressure is 40.9 mmHg. Left Atrium: Left atrial size was normal in size. Right Atrium: Right atrial size was normal in size. Pericardium: There is no evidence of pericardial effusion. Mitral Valve: The mitral valve is grossly normal. No evidence of mitral valve regurgitation. Tricuspid Valve: The tricuspid valve is grossly normal. Tricuspid valve regurgitation is trivial. Aortic Valve: Planimetry AVA 1.2 cm2. There is moderate calcification of the aortic valve. Aortic valve regurgitation is not visualized. Mild aortic stenosis is present. Aortic valve mean gradient measures 17.3 mmHg. Aortic valve peak gradient measures 28.4 mmHg. Aortic valve area, by VTI measures 0.84 cm. Pulmonic Valve: The pulmonic valve was not well visualized. Pulmonic valve regurgitation is not visualized. Aorta: There is borderline dilatation of the ascending aorta, measuring 35 mm. Venous: The inferior vena cava is normal in size with greater than 50% respiratory variability,  suggesting right atrial pressure of 3 mmHg. IAS/Shunts: The interatrial septum was not well visualized.  LEFT VENTRICLE PLAX 2D LVIDd:         5.30 cm   Diastology LVIDs:         4.00 cm   LV e' medial:    4.65 cm/s LV PW:         0.70 cm   LV E/e' medial:  8.5 LV IVS:        0.80 cm   LV e' lateral:   7.05 cm/s LVOT diam:     2.20 cm   LV E/e' lateral: 5.6 LV SV:         42 LV SV Index:   23 LVOT Area:     3.80 cm  RIGHT VENTRICLE RV S prime:     11.80 cm/s TAPSE (M-mode): 2.1 cm LEFT ATRIUM             Index        RIGHT ATRIUM          Index LA diam:        3.90 cm 2.10 cm/m   RA Area:     9.57 cm LA Vol (A2C):   42.5 ml 22.88 ml/m  RA Volume:   19.00 ml 10.23 ml/m LA Vol (A4C):   50.0 ml 26.92 ml/m LA Biplane Vol: 49.4 ml 26.59 ml/m  AORTIC VALVE AV Area (Vmax):    0.92 cm AV Area (Vmean):   0.80 cm AV Area (VTI):     0.84 cm AV Vmax:           266.33 cm/s AV Vmean:          195.667 cm/s AV VTI:            0.504 m AV Peak Grad:      28.4 mmHg AV Mean  Grad:      17.3 mmHg LVOT Vmax:         64.30 cm/s LVOT Vmean:        41.200 cm/s LVOT VTI:          0.111 m LVOT/AV VTI ratio: 0.22  AORTA Ao Root diam: 3.50 cm Ao Asc diam:  3.50 cm MITRAL VALVE               TRICUSPID VALVE MV Area (PHT): 4.60 cm    TR Peak grad:   18.0 mmHg MV Decel Time: 165 msec    TR Vmax:        212.00 cm/s MV E velocity: 39.30 cm/s MV A velocity: 83.80 cm/s  SHUNTS MV E/A ratio:  0.47        Systemic VTI:  0.11 m                            Systemic Diam: 2.20 cm Phineas Inches Electronically signed by Phineas Inches Signature Date/Time: 10/18/2021/11:28:38 AM    Final    MR Cervical Spine Wo Contrast  Result Date: 10/18/2021 CLINICAL DATA:  87 year old male status post blunt trauma with cervical spinal ankylosis and suspicion of acute injury at the cervicothoracic junction by CT. EXAM: MRI CERVICAL SPINE WITHOUT CONTRAST TECHNIQUE: Multiplanar, multisequence MR imaging of the cervical spine was performed. No intravenous contrast was  administered. COMPARISON:  CT cervical spine 0115 hours today. Brain MRI 08/25/2021. FINDINGS: Alignment: Stable from the earlier CT. Straightening of cervical lordosis with 2-3 mm of anterolisthesis C7 on T1. Vertebrae: Ankylosis C2 through C5 (note right C4-C5 facet ankylosis) as demonstrated by CT. Degenerative appearing endplate changes C4 through C7. Subtle superior endplate compression at both T1 and T2 levels is not accompanied by convincing marrow edema. And no convincing acute osseous abnormality by MRI elsewhere. Background bone marrow signal is within normal limits. Cord: Limited spinal cord detail on axial images and sagittal stir due to motion artifact. No convincing visible cord signal abnormality. Capacious spinal canal at most levels. Posterior Fossa, vertebral arteries, paraspinal tissues: Chronic degenerative ligamentous hypertrophy about the odontoid. Cervicomedullary junction appears stable since June and otherwise negative. Brain is detailed separately today. Preserved major vascular flow voids in the neck. Mild patchy abnormal increased ligamentous STIR signal posteriorly C4-C5 through C6-C7 interspinous ligaments (series 7, image 19) and along the posterior left C6 facets (image 13). Associated trace abnormal prevertebral fluid or edema from C4 through T1 (series 7, image 8 and series 8, image 12). Furthermore, possible more generalized muscle edema in the lateral right longus coli on series 7, image 1. No other discrete muscle injury identified. Otherwise negative visible neck soft tissues, right lung apex. Disc levels: C2-C3:  Ankylosis. C3-C4:  Ankylosis. C4-C5: Moderate to severe facet hypertrophy with ankylosis on the right side demonstrated by CT, possible also lateral interbody ankylosis related to bulky endplate spurring here. Associated severe bilateral C5 foraminal stenosis. C5-C6: Disc space loss with circumferential disc osteophyte complex and mild to moderate facet hypertrophy.  Mild ligament flavum hypertrophy. Borderline to mild spinal stenosis. Moderate left and moderate to severe right C6 foraminal stenosis. C6-C7: Disc space loss with circumferential disc osteophyte complex. Moderate bilateral facet hypertrophy. Mild ligament flavum hypertrophy. Borderline to mild spinal stenosis. Moderate to severe bilateral C7 foraminal stenosis greater on the left. C7-T1: Conspicuous increased T2 and STIR signal in the posterior disc space here (series 7, image 8). Underlying chronic appearing  anterolisthesis with moderate bilateral facet hypertrophy. No convincing epidural blood. No spinal stenosis. No significant foraminal stenosis. IMPRESSION: 1. Evidence of mild to moderate acute anterior and posterior cervical ligamentous injury. Difficult to exclude acute injury to the posterior C7-T1 disc. And possible acute muscle injury of the right lateral longus coli. No convincing epidural hemorrhage or spinal cord injury. 2. Underlying cervical spine ankylosis C2 through C5 as demonstrated by CT. Mild superior endplate deformities of both T1 and T2 appear chronic. 3. Advanced cervical spine degeneration elsewhere. Moderate or severe neural foraminal stenosis from the bilateral C5 through C7 nerve levels. But only borderline to mild spinal stenosis at C5-C6 and C6-C7. Electronically Signed   By: Genevie Ann M.D.   On: 10/18/2021 05:14   MR BRAIN WO CONTRAST  Result Date: 10/18/2021 CLINICAL DATA:  87 year old male status post blunt trauma. Persistent dizziness. EXAM: MRI HEAD WITHOUT CONTRAST TECHNIQUE: Multiplanar, multiecho pulse sequences of the brain and surrounding structures were obtained without intravenous contrast. COMPARISON:  Head CT earlier today.  Brain MRI 08/25/2021. FINDINGS: Brain: No restricted diffusion to suggest acute infarction. No midline shift, mass effect, evidence of mass lesion, ventriculomegaly, extra-axial collection or acute intracranial hemorrhage. Cervicomedullary junction  and pituitary are within normal limits. Pearline Cables and white matter signal throughout the brain appears stable since June and largely normal for age. No convincing cortical encephalomalacia or chronic cerebral blood products. Mild T2 heterogeneity scattered in the deep gray matter and deep white matter capsules. Brainstem and cerebellum remain normal. Vascular: Major intracranial vascular flow voids are stable since June. There is a degree of generalized intracranial artery tortuosity. Skull and upper cervical spine: Dedicated cervical spine MRI reported separately. Evidence of upper cervical ankylosis with chronic degenerative ligamentous hypertrophy about the odontoid. Visualized bone marrow signal is within normal limits. Sinuses/Orbits: Stable, negative. Other: Mastoids remain clear. Right anterior superior convexity scalp hematoma, series 11, image 19. IMPRESSION: 1. No acute intracranial abnormality. Stable since June and largely normal for age noncontrast MRI appearance of the brain. 2. Right anterior convexity scalp hematoma. Dedicated Cervical Spine MRI reported separately. Electronically Signed   By: Genevie Ann M.D.   On: 10/18/2021 04:47   CT Cervical Spine Wo Contrast  Result Date: 10/18/2021 CLINICAL DATA:  Blunt facial trauma EXAM: CT CERVICAL SPINE WITHOUT CONTRAST TECHNIQUE: Multidetector CT imaging of the cervical spine was performed without intravenous contrast. Multiplanar CT image reconstructions were also generated. RADIATION DOSE REDUCTION: This exam was performed according to the departmental dose-optimization program which includes automated exposure control, adjustment of the mA and/or kV according to patient size and/or use of iterative reconstruction technique. COMPARISON:  None Available. FINDINGS: Alignment: Slight anterior subluxation of C7 on T1. Suggestion of mild cortical irregularity at the superior anterior endplate of C7. Changes may represent acute ligamentous injury. MRI suggested  for further evaluation. Skull base and vertebrae: Skull base appears intact. Slight cortical irregularity at the superior anterior endplate of C7 as discussed. No additional vertebral compression deformities. No focal bone lesion or bone destruction. Soft tissues and spinal canal: No prevertebral soft tissue swelling is identified. No abnormal paraspinal soft tissue mass or infiltration. Disc levels: Degenerative changes with disc space narrowing and endplate osteophyte formation throughout. Degenerative changes in the posterior facet joints. Uncovertebral spurring causes some encroachment upon the neural foramina bilaterally. Upper chest: Negative. Other: None. IMPRESSION: 1. Slight anterior subluxation of C7 on T1 with suggestion of slight cortical compression of the anterior superior endplate of C7, possibly ligamentous injury. MRI  suggested for further evaluation. 2. Degenerative changes throughout the cervical spine. Electronically Signed   By: Lucienne Capers M.D.   On: 10/18/2021 01:39   CT Head Wo Contrast  Result Date: 10/18/2021 CLINICAL DATA:  Syncope or presyncope with cerebrovascular cause suspected EXAM: CT HEAD WITHOUT CONTRAST TECHNIQUE: Contiguous axial images were obtained from the base of the skull through the vertex without intravenous contrast. RADIATION DOSE REDUCTION: This exam was performed according to the departmental dose-optimization program which includes automated exposure control, adjustment of the mA and/or kV according to patient size and/or use of iterative reconstruction technique. COMPARISON:  CT head 08/24/2021.  MRI brain 08/25/2021 FINDINGS: Brain: No evidence of acute infarction, hemorrhage, hydrocephalus, extra-axial collection or mass lesion/mass effect. Mild cerebral atrophy. Vascular: No hyperdense vessel or unexpected calcification. Skull: Normal. Negative for fracture or focal lesion. Sinuses/Orbits: No acute finding. Other: None. IMPRESSION: No acute intracranial  abnormalities.  Mild cerebral atrophy. Electronically Signed   By: Lucienne Capers M.D.   On: 10/18/2021 01:36   DG Chest Portable 1 View  Result Date: 10/17/2021 CLINICAL DATA:  Status post fall. EXAM: PORTABLE CHEST 1 VIEW COMPARISON:  May 11, 2006 FINDINGS: The heart size and mediastinal contours are within normal limits. Both lungs are clear. The visualized skeletal structures are unremarkable. IMPRESSION: No active cardiopulmonary disease. Electronically Signed   By: Virgina Norfolk M.D.   On: 10/17/2021 23:10      Subjective: Patient seen and examined at bedside this morning.  Hemodynamically stable for discharge today.  Has some cough which is dry.  Discharge planning explained with the daughter at the bedside  Discharge Exam: Vitals:   10/20/21 0350 10/20/21 0855  BP: 132/75 (!) 138/93  Pulse: 69   Resp: (!) 21 19  Temp: 98 F (36.7 C)   SpO2: 98%    Vitals:   10/19/21 1045 10/19/21 1934 10/20/21 0350 10/20/21 0855  BP: 123/67 128/79 132/75 (!) 138/93  Pulse:  74 69   Resp:  18 (!) 21 19  Temp:  98 F (36.7 C) 98 F (36.7 C)   TempSrc:  Oral    SpO2:  98% 98%   Weight:   69 kg   Height:        General: Pt is alert, awake, not in acute distress,C collar Cardiovascular: RRR, S1/S2 +, no rubs, no gallops Respiratory: CTA bilaterally, no wheezing, no rhonchi Abdominal: Soft, NT, ND, bowel sounds + Extremities: no edema, no cyanosis    The results of significant diagnostics from this hospitalization (including imaging, microbiology, ancillary and laboratory) are listed below for reference.     Microbiology: No results found for this or any previous visit (from the past 240 hour(s)).   Labs: BNP (last 3 results) No results for input(s): "BNP" in the last 8760 hours. Basic Metabolic Panel: Recent Labs  Lab 10/17/21 2245 10/19/21 0149 10/20/21 0220  NA 137 137 136  K 4.7 4.8 4.8  CL 106 106 104  CO2 '23 24 27  '$ GLUCOSE 109* 119* 111*  BUN 31* 25*  31*  CREATININE 1.74* 1.68* 1.74*  CALCIUM 9.0 8.8* 8.8*   Liver Function Tests: No results for input(s): "AST", "ALT", "ALKPHOS", "BILITOT", "PROT", "ALBUMIN" in the last 168 hours. No results for input(s): "LIPASE", "AMYLASE" in the last 168 hours. No results for input(s): "AMMONIA" in the last 168 hours. CBC: Recent Labs  Lab 10/17/21 2245 10/19/21 0149  WBC 11.4* 9.5  NEUTROABS 8.8*  --   HGB 12.4* 11.6*  HCT 38.5* 34.8*  MCV 98.0 96.4  PLT 217 196   Cardiac Enzymes: No results for input(s): "CKTOTAL", "CKMB", "CKMBINDEX", "TROPONINI" in the last 168 hours. BNP: Invalid input(s): "POCBNP" CBG: No results for input(s): "GLUCAP" in the last 168 hours. D-Dimer No results for input(s): "DDIMER" in the last 72 hours. Hgb A1c No results for input(s): "HGBA1C" in the last 72 hours. Lipid Profile No results for input(s): "CHOL", "HDL", "LDLCALC", "TRIG", "CHOLHDL", "LDLDIRECT" in the last 72 hours. Thyroid function studies Recent Labs    10/19/21 1219  TSH 0.682   Anemia work up No results for input(s): "VITAMINB12", "FOLATE", "FERRITIN", "TIBC", "IRON", "RETICCTPCT" in the last 72 hours. Urinalysis No results found for: "COLORURINE", "APPEARANCEUR", "LABSPEC", "PHURINE", "GLUCOSEU", "HGBUR", "BILIRUBINUR", "KETONESUR", "PROTEINUR", "UROBILINOGEN", "NITRITE", "LEUKOCYTESUR" Sepsis Labs Recent Labs  Lab 10/17/21 2245 10/19/21 0149  WBC 11.4* 9.5   Microbiology No results found for this or any previous visit (from the past 240 hour(s)).  Please note: You were cared for by a hospitalist during your hospital stay. Once you are discharged, your primary care physician will handle any further medical issues. Please note that NO REFILLS for any discharge medications will be authorized once you are discharged, as it is imperative that you return to your primary care physician (or establish a relationship with a primary care physician if you do not have one) for your post  hospital discharge needs so that they can reassess your need for medications and monitor your lab values.    Time coordinating discharge: 40 minutes  SIGNED:   Shelly Coss, MD  [facility] 10/20/2021, 10:29 AM Pager [phone]  If 7PM-7AM, please contact night-coverage [email or web address] Password [email or web address]

## 2021-10-20 NOTE — Progress Notes (Signed)
IV removed, daughter at bedside for transport, patient wearing collar, has incentive spirometer and has demonstrated use. Instructions reviewed at bedside. To go home with home health services.

## 2021-10-20 NOTE — Plan of Care (Signed)
  Problem: Education: Goal: Knowledge of General Education information will improve Description: Including pain rating scale, medication(s)/side effects and non-pharmacologic comfort measures 10/20/2021 1105 by Shelton Silvas, RN Outcome: Adequate for Discharge 10/20/2021 1105 by Shelton Silvas, RN Outcome: Adequate for Discharge   Problem: Health Behavior/Discharge Planning: Goal: Ability to manage health-related needs will improve 10/20/2021 1105 by Shelton Silvas, RN Outcome: Adequate for Discharge 10/20/2021 1105 by Shelton Silvas, RN Outcome: Adequate for Discharge   Problem: Clinical Measurements: Goal: Ability to maintain clinical measurements within normal limits will improve 10/20/2021 1105 by Shelton Silvas, RN Outcome: Adequate for Discharge 10/20/2021 1105 by Shelton Silvas, RN Outcome: Adequate for Discharge Goal: Will remain free from infection 10/20/2021 1105 by Shelton Silvas, RN Outcome: Adequate for Discharge 10/20/2021 1105 by Shelton Silvas, RN Outcome: Adequate for Discharge Goal: Diagnostic test results will improve 10/20/2021 1105 by Shelton Silvas, RN Outcome: Adequate for Discharge 10/20/2021 1105 by Shelton Silvas, RN Outcome: Adequate for Discharge Goal: Respiratory complications will improve 10/20/2021 1105 by Shelton Silvas, RN Outcome: Adequate for Discharge 10/20/2021 1105 by Shelton Silvas, RN Outcome: Adequate for Discharge Goal: Cardiovascular complication will be avoided 10/20/2021 1105 by Shelton Silvas, RN Outcome: Adequate for Discharge 10/20/2021 1105 by Shelton Silvas, RN Outcome: Adequate for Discharge   Problem: Activity: Goal: Risk for activity intolerance will decrease 10/20/2021 1105 by Shelton Silvas, RN Outcome: Adequate for Discharge 10/20/2021 1105 by Shelton Silvas, RN Outcome: Adequate for Discharge   Problem: Nutrition: Goal: Adequate nutrition will be maintained 10/20/2021 1105 by Shelton Silvas, RN Outcome:  Adequate for Discharge 10/20/2021 1105 by Shelton Silvas, RN Outcome: Adequate for Discharge   Problem: Coping: Goal: Level of anxiety will decrease 10/20/2021 1105 by Shelton Silvas, RN Outcome: Adequate for Discharge 10/20/2021 1105 by Shelton Silvas, RN Outcome: Adequate for Discharge   Problem: Elimination: Goal: Will not experience complications related to bowel motility 10/20/2021 1105 by Shelton Silvas, RN Outcome: Adequate for Discharge 10/20/2021 1105 by Shelton Silvas, RN Outcome: Adequate for Discharge Goal: Will not experience complications related to urinary retention 10/20/2021 1105 by Shelton Silvas, RN Outcome: Adequate for Discharge 10/20/2021 1105 by Shelton Silvas, RN Outcome: Adequate for Discharge   Problem: Pain Managment: Goal: General experience of comfort will improve 10/20/2021 1105 by Shelton Silvas, RN Outcome: Adequate for Discharge 10/20/2021 1105 by Shelton Silvas, RN Outcome: Adequate for Discharge   Problem: Safety: Goal: Ability to remain free from injury will improve 10/20/2021 1105 by Shelton Silvas, RN Outcome: Adequate for Discharge 10/20/2021 1105 by Shelton Silvas, RN Outcome: Adequate for Discharge   Problem: Skin Integrity: Goal: Risk for impaired skin integrity will decrease 10/20/2021 1105 by Shelton Silvas, RN Outcome: Adequate for Discharge 10/20/2021 1105 by Shelton Silvas, RN Outcome: Adequate for Discharge

## 2021-10-20 NOTE — Progress Notes (Signed)
Physical Therapy Treatment Patient Details Name: Justin Shaw MRN: [id number] DOB: 05-04-1934 Today's Date: 10/20/2021   History of Present Illness 87 y.o. male presented to ED on 10/17/2021 after syncopal episode and fall, sustaining laceration to the head. hx of vertigo.   MRI with concerns for ligamentous injury/strain.  Neurosurgery recommends immobilization in an Aspen collar and OP f/u in their office.    PT Comments    Pt tolerates therapy well today, ambulating limited community distances and participating in stair and balance training. Pt has one posterior LOB while negotiating stairs, but able to self-correct. PT educates for step-to gait on stairs as pt continues to build strength. Pt with increased difficulty with Rhomberg and tandem stance with eyes closed and significant difficulty with single-leg stance (B LE). Pt tolerates balance on soft surface and balance with perturbations well. Continued therapy will assist the patient in improving balance, strength, and activity tolerance for progression towards prior level of function and independence.    Recommendations for follow up therapy are one component of a multi-disciplinary discharge planning process, led by the attending physician.  Recommendations may be updated based on patient status, additional functional criteria and insurance authorization.  Follow Up Recommendations  Home health PT     Assistance Recommended at Discharge Intermittent Supervision/Assistance  Patient can return home with the following A little help with walking and/or transfers;A little help with bathing/dressing/bathroom   Equipment Recommendations  None recommended by PT    Recommendations for Other Services       Precautions / Restrictions Precautions Precautions: Fall Precaution Comments: handout provided Required Braces or Orthoses: Cervical Brace Cervical Brace: Hard collar;At all times Restrictions Weight Bearing Restrictions: No      Mobility  Bed Mobility                    Transfers Overall transfer level: Needs assistance Equipment used: None Transfers: Sit to/from Stand Sit to Stand: Supervision                Ambulation/Gait Ambulation/Gait assistance: Supervision Gait Distance (Feet): 200 Feet (400' (200, stairs, 200)) Assistive device: None Gait Pattern/deviations: Step-through pattern, Drifts right/left Gait velocity: functional Gait velocity interpretation: >2.62 ft/sec, indicative of community ambulatory       Stairs Stairs: Yes Stairs assistance: Min guard Stair Management: One rail Right, Forwards, Alternating pattern Number of Stairs: 6 General stair comments: Pt with one LOB on stairs but able to self-correct; PT educates for step-to pattern on stairs until LE are stronger   Wheelchair Mobility    Modified Rankin (Stroke Patients Only)       Balance Overall balance assessment: Needs assistance Sitting-balance support: No upper extremity supported, Feet supported Sitting balance-Leahy Scale: Good     Standing balance support: No upper extremity supported, During functional activity Standing balance-Leahy Scale: Good Standing balance comment: Pt participates in balance training: rhomberg, tandem, eyes closed, standing on pillow/pad for proprioception; perturbations eyes open and closed for ankle stability, postural muscles, and balance while not anticipating outside force Single Leg Stance - Right Leg: 5 Single Leg Stance - Left Leg: 7 Tandem Stance - Right Leg: 15 Tandem Stance - Left Leg: 15 Rhomberg - Eyes Opened: 15 Rhomberg - Eyes Closed: 15 High level balance activites: Backward walking (pt temporarily walks backward to chair with posterior LOB, PT provides minA to stabilize)              Cognition Arousal/Alertness: Awake/alert Behavior During Therapy: [facility] for tasks assessed/performed  Overall Cognitive Status: Within Functional Limits for tasks  assessed                                          Exercises      General Comments General comments (skin integrity, edema, etc.): VSS on RA      Pertinent Vitals/Pain Pain Assessment Pain Assessment: No/denies pain    Home Living Family/patient expects to be discharged to:: Private residence Living Arrangements: Spouse/significant other                      Prior Function            PT Goals (current goals can now be found in the care plan section) Acute Rehab PT Goals Patient Stated Goal: to return home PT Goal Formulation: With patient Time For Goal Achievement: 11/01/21 Potential to Achieve Goals: Good Progress towards PT goals: Progressing toward goals    Frequency    Min 4X/week      PT Plan Current plan remains appropriate    Co-evaluation              AM-PAC PT "6 Clicks" Mobility   Outcome Measure  Help needed turning from your back to your side while in a flat bed without using bedrails?: A Little Help needed moving from lying on your back to sitting on the side of a flat bed without using bedrails?: A Little Help needed moving to and from a bed to a chair (including a wheelchair)?: A Little Help needed standing up from a chair using your arms (e.g., wheelchair or bedside chair)?: A Little Help needed to walk in hospital room?: A Little Help needed climbing 3-5 steps with a railing? : A Little 6 Click Score: 18    End of Session Equipment Utilized During Treatment: Gait belt Activity Tolerance: Patient tolerated treatment well Patient left: in chair;with call bell/phone within reach;with chair alarm set;with family/visitor present Nurse Communication: Mobility status PT Visit Diagnosis: Other abnormalities of gait and mobility (R26.89);History of falling (Z91.81);Dizziness and giddiness (R42)     Time: 9622-2979 PT Time Calculation (min) (ACUTE ONLY): 33 min  Charges:  $Gait Training: 8-22 mins $Neuromuscular  Re-education: 8-22 mins                     Hall Busing, SPT Acute Rehabilitation Office #: [phone]    Hall Busing 10/20/2021, 11:17 AM

## 2021-10-20 NOTE — Progress Notes (Signed)
Progress Note  Patient Name: Justin Shaw Date of Encounter: 10/20/2021  [facility] Cardiologist: Elouise Munroe, MD   Subjective   Patient is doing well this AM. Has a mild cough. Denies chest pain, dizziness, sob.   Inpatient Medications    Scheduled Meds:  cholecalciferol  2,000 Units Oral Daily   enoxaparin (LOVENOX) injection  40 mg Subcutaneous Q24H   fluticasone  1 spray Each Nare Daily   pravastatin  40 mg Oral q1800   sodium chloride flush  3 mL Intravenous Q12H   Continuous Infusions:  lactated ringers Stopped (10/19/21 1032)   PRN Meds: acetaminophen **OR** acetaminophen, ALPRAZolam, hydrALAZINE, ondansetron **OR** ondansetron (ZOFRAN) IV   Vital Signs    Vitals:   10/19/21 0459 10/19/21 1045 10/19/21 1934 10/20/21 0350  BP: 132/71 123/67 128/79 132/75  Pulse: 84  74 69  Resp: 18  18 (!) 21  Temp: 98 F (36.7 C)  98 F (36.7 C) 98 F (36.7 C)  TempSrc: Oral  Oral   SpO2: 99%  98% 98%  Weight: 68.8 kg   69 kg  Height:        Intake/Output Summary (Last 24 hours) at 10/20/2021 0840 Last data filed at 10/19/2021 2300 Gross per 24 hour  Intake 580 ml  Output 850 ml  Net -270 ml      10/20/2021    3:50 AM 10/19/2021    4:59 AM 10/18/2021    3:27 PM  Last 3 Weights  Weight (lbs) 152 lb 3.2 oz 151 lb 9.6 oz 149 lb 7.6 oz  Weight (kg) 69.037 kg 68.765 kg 67.8 kg      Telemetry    Sinus rhythm, occasional PVCs and PACs. HR in the 60s-120s - Personally Reviewed  ECG    No new tracings - Personally Reviewed  Physical Exam   GEN: No acute distress.  Sitting comfortably in the chair  Neck: Wearing C-collar Cardiac: Regular rhythm, tachycardic. Radial pulses 2+ bilaterally  Respiratory: Clear to auscultation bilaterally. GI: Soft, nontender, non-distended  MS: No edema; No deformity. Neuro:  Nonfocal  Psych: Normal affect   Labs    High Sensitivity Troponin:   Recent Labs  Lab 10/17/21 2245 10/18/21 0126  TROPONINIHS 12 14      Chemistry Recent Labs  Lab 10/17/21 2245 10/19/21 0149 10/20/21 0220  NA 137 137 136  K 4.7 4.8 4.8  CL 106 106 104  CO2 '23 24 27  '$ GLUCOSE 109* 119* 111*  BUN 31* 25* 31*  CREATININE 1.74* 1.68* 1.74*  CALCIUM 9.0 8.8* 8.8*  GFRNONAA 38* 40* 38*  ANIONGAP '8 7 5    '$ Lipids No results for input(s): "CHOL", "TRIG", "HDL", "LABVLDL", "LDLCALC", "CHOLHDL" in the last 168 hours.  Hematology Recent Labs  Lab 10/17/21 2245 10/19/21 0149  WBC 11.4* 9.5  RBC 3.93* 3.61*  HGB 12.4* 11.6*  HCT 38.5* 34.8*  MCV 98.0 96.4  MCH 31.6 32.1  MCHC 32.2 33.3  RDW 11.9 11.9  PLT 217 196   Thyroid  Recent Labs  Lab 10/19/21 1219  TSH 0.682    BNPNo results for input(s): "BNP", "PROBNP" in the last 168 hours.  DDimer No results for input(s): "DDIMER" in the last 168 hours.   Radiology    ECHOCARDIOGRAM LIMITED  Result Date: 10/19/2021    ECHOCARDIOGRAM LIMITED REPORT   Patient Name:   Justin Shaw Date of Exam: 10/19/2021 Medical Rec #:  [id number]        Height:  70.0 in Accession #:    [id number]       Weight:       151.6 lb Date of Birth:  May 02, 1934        BSA:          1.855 m Patient Age:    87 years         BP:           123/67 mmHg Patient Gender: M                HR:           72 bpm. Exam Location:  Inpatient Procedure: Limited Echo and Intracardiac Opacification Agent Indications:    Dilated cardiomyopathy  History:        Patient has prior history of Echocardiogram examinations, most                 recent 10/18/2021. Signs/Symptoms:Syncope; Risk                 Factors:Hypertension and Dyslipidemia.  Sonographer:    Merrie Roof RDCS Referring Phys: Eber Hong TURNER IMPRESSIONS  1. Left ventricular ejection fraction, by estimation, is 50%. Left ventricular ejection fraction by 2D MOD biplane is 50.4 %. The left ventricle has low normal function. FINDINGS  Left Ventricle: Left ventricular ejection fraction, by estimation, is 50%. Left ventricular ejection fraction by 2D MOD  biplane is 50.4 %. The left ventricle has low normal function. Definity contrast agent was given IV to delineate the left ventricular  endocardial borders.  LV Volumes (MOD)               Biplane EF (MOD) LV vol d, MOD    101.7 ml      LV Biplane EF:   Left A2C:                                            ventricular LV vol d, MOD    113.5 ml                       ejection A4C:                                            fraction by LV vol s, MOD    42.4 ml                        2D MOD A2C:                                            biplane is LV vol s, MOD    61.0 ml                        50.4 %. A4C: LV SV MOD A2C:   59.3 ml LV SV MOD A4C:   113.5 ml LV SV MOD BP:    54.5 ml Cherlynn Kaiser MD Electronically signed by Cherlynn Kaiser MD Signature Date/Time: 10/19/2021/7:19:57 PM    Final    US RENAL  Result Date: 10/19/2021 CLINICAL DATA:  Acute  kidney injury, history hypertension EXAM: RENAL / URINARY TRACT ULTRASOUND COMPLETE COMPARISON:  01/03/2017 FINDINGS: Right Kidney: Renal measurements: 10.3 x 4.8 x 5.5 cm = volume: 144 mL. Cortical thinning. Mildly increased cortical echogenicity. Large mid RIGHT renal cyst 4.6 x 4.5 x 4.6 cm, previously 5.2 x 4.9 x 4.7 cm. Additional cyst at upper pole 3.6 x 3.3 x 3.8 cm, previously 3.3 x 2.7 x 3.1 cm. Smaller superior cyst contains a single tiny peripheral septation. The larger mid cyst shows a single partial septation at midportion. No definite mural nodularity. No shadowing calculi or solid masses. No hydronephrosis. Left Kidney: Renal measurements: 8.9 x 5.4 x 5.9 cm = volume: 146 mL. Cortical thinning. Increased cortical echogenicity. No mass, hydronephrosis, or shadowing calcification. Bladder: Appears normal for degree of bladder distention. Other: N/A IMPRESSION: Cortical atrophy and medical renal disease changes of both kidneys. Two minimally complicated cysts within the RIGHT kidney, 1 slightly increased in size and 1 slightly decreased in size since  previous exam. No additional renal sonographic abnormalities. Electronically Signed   By: Lavonia Dana M.D.   On: 10/19/2021 13:15   ECHOCARDIOGRAM COMPLETE  Result Date: 10/18/2021    ECHOCARDIOGRAM REPORT   Patient Name:   Justin Shaw Date of Exam: 10/18/2021 Medical Rec #:  [id number]        Height:       70.0 in Accession #:    [id number]       Weight:       152.0 lb Date of Birth:  11-15-1934        BSA:          1.858 m Patient Age:    57 years         BP:           122/87 mmHg Patient Gender: M                HR:           96 bpm. Exam Location:  Inpatient Procedure: 2D Echo, Cardiac Doppler and Color Doppler Indications:    Syncope R55  History:        Patient has no prior history of Echocardiogram examinations.                 Signs/Symptoms:Chest Pain; Risk Factors:Hypertension and                 Dyslipidemia.  Sonographer:    Bernadene Person RDCS Referring Phys: [id number] JENNIFER YATES  Sonographer Comments: Image acquisition challenging due to respiratory motion. IMPRESSIONS  1. Windows are challenging to fully assess wall motion. Left ventricular ejection fraction, by estimation, is 45 to 50%. The left ventricle has mildly decreased function. The left ventricle demonstrates global hypokinesis. Left ventricular diastolic parameters are consistent with Grade I diastolic dysfunction (impaired relaxation).  2. Right ventricular systolic function is normal. The right ventricular size is normal. There is normal pulmonary artery systolic pressure.  3. The mitral valve is grossly normal. No evidence of mitral valve regurgitation.  4. Planimetry AVA 1.2 cm2. There is moderate calcification of the aortic valve. Aortic valve regurgitation is not visualized. Mild aortic valve stenosis.  5. There is borderline dilatation of the ascending aorta, measuring 35 mm.  6. The inferior vena cava is normal in size with greater than 50% respiratory variability, suggesting right atrial pressure of 3 mmHg. Comparison(s): No  prior Echocardiogram. FINDINGS  Left Ventricle: Windows are challenging to fully assess wall motion. Left ventricular ejection  fraction, by estimation, is 45 to 50%. The left ventricle has mildly decreased function. The left ventricle demonstrates global hypokinesis. The left ventricular internal cavity size was normal in size. There is no left ventricular hypertrophy. Left ventricular diastolic parameters are consistent with Grade I diastolic dysfunction (impaired relaxation). Right Ventricle: The right ventricular size is normal. Right vetricular wall thickness was not well visualized. Right ventricular systolic function is normal. There is normal pulmonary artery systolic pressure. The tricuspid regurgitant velocity is 2.12 m/s, and with an assumed right atrial pressure of 3 mmHg, the estimated right ventricular systolic pressure is 81.1 mmHg. Left Atrium: Left atrial size was normal in size. Right Atrium: Right atrial size was normal in size. Pericardium: There is no evidence of pericardial effusion. Mitral Valve: The mitral valve is grossly normal. No evidence of mitral valve regurgitation. Tricuspid Valve: The tricuspid valve is grossly normal. Tricuspid valve regurgitation is trivial. Aortic Valve: Planimetry AVA 1.2 cm2. There is moderate calcification of the aortic valve. Aortic valve regurgitation is not visualized. Mild aortic stenosis is present. Aortic valve mean gradient measures 17.3 mmHg. Aortic valve peak gradient measures 28.4 mmHg. Aortic valve area, by VTI measures 0.84 cm. Pulmonic Valve: The pulmonic valve was not well visualized. Pulmonic valve regurgitation is not visualized. Aorta: There is borderline dilatation of the ascending aorta, measuring 35 mm. Venous: The inferior vena cava is normal in size with greater than 50% respiratory variability, suggesting right atrial pressure of 3 mmHg. IAS/Shunts: The interatrial septum was not well visualized.  LEFT VENTRICLE PLAX 2D LVIDd:          5.30 cm   Diastology LVIDs:         4.00 cm   LV e' medial:    4.65 cm/s LV PW:         0.70 cm   LV E/e' medial:  8.5 LV IVS:        0.80 cm   LV e' lateral:   7.05 cm/s LVOT diam:     2.20 cm   LV E/e' lateral: 5.6 LV SV:         42 LV SV Index:   23 LVOT Area:     3.80 cm  RIGHT VENTRICLE RV S prime:     11.80 cm/s TAPSE (M-mode): 2.1 cm LEFT ATRIUM             Index        RIGHT ATRIUM          Index LA diam:        3.90 cm 2.10 cm/m   RA Area:     9.57 cm LA Vol (A2C):   42.5 ml 22.88 ml/m  RA Volume:   19.00 ml 10.23 ml/m LA Vol (A4C):   50.0 ml 26.92 ml/m LA Biplane Vol: 49.4 ml 26.59 ml/m  AORTIC VALVE AV Area (Vmax):    0.92 cm AV Area (Vmean):   0.80 cm AV Area (VTI):     0.84 cm AV Vmax:           266.33 cm/s AV Vmean:          195.667 cm/s AV VTI:            0.504 m AV Peak Grad:      28.4 mmHg AV Mean Grad:      17.3 mmHg LVOT Vmax:         64.30 cm/s LVOT Vmean:        41.200 cm/s LVOT  VTI:          0.111 m LVOT/AV VTI ratio: 0.22  AORTA Ao Root diam: 3.50 cm Ao Asc diam:  3.50 cm MITRAL VALVE               TRICUSPID VALVE MV Area (PHT): 4.60 cm    TR Peak grad:   18.0 mmHg MV Decel Time: 165 msec    TR Vmax:        212.00 cm/s MV E velocity: 39.30 cm/s MV A velocity: 83.80 cm/s  SHUNTS MV E/A ratio:  0.47        Systemic VTI:  0.11 m                            Systemic Diam: 2.20 cm Phineas Inches Electronically signed by Phineas Inches Signature Date/Time: 10/18/2021/11:28:38 AM    Final     Cardiac Studies   Echocardiogram Limited 11-11-2021  1. Left ventricular ejection fraction, by estimation, is 50%. Left  ventricular ejection fraction by 2D MOD biplane is 50.4 %. The left  ventricle has low normal function.   FINDINGS   Left Ventricle: Left ventricular ejection fraction, by estimation, is  50%. Left ventricular ejection fraction by 2D MOD biplane is 50.4 %. The  left ventricle has low normal function. Definity contrast agent was given  IV to delineate the left ventricular    endocardial borders.   Echocardiogram Complete 10/18/21  1. Windows are challenging to fully assess wall motion. Left ventricular  ejection fraction, by estimation, is 45 to 50%. The left ventricle has  mildly decreased function. The left ventricle demonstrates global  hypokinesis. Left ventricular diastolic  parameters are consistent with Grade I diastolic dysfunction (impaired  relaxation).   2. Right ventricular systolic function is normal. The right ventricular  size is normal. There is normal pulmonary artery systolic pressure.   3. The mitral valve is grossly normal. No evidence of mitral valve  regurgitation.   4. Planimetry AVA 1.2 cm2. There is moderate calcification of the aortic  valve. Aortic valve regurgitation is not visualized. Mild aortic valve  stenosis.   5. There is borderline dilatation of the ascending aorta, measuring 35  mm.   6. The inferior vena cava is normal in size with greater than 50%  respiratory variability, suggesting right atrial pressure of 3 mmHg.   Patient Profile     87 y.o. male with a past medical history of HLD, HTN who is being evaluated for syncope   Assessment & Plan    Syncope  - Patient presented after a possible syncopal episode. Reportedly, patient had been having dizzy spells/vertigo that started 2 days prior to syncope. Patient had been sleeping in a recliner because he was having vertigo while laying flat, but he tried to sleep in his bed. He fell out of the bed. It is unclear if he rolled over in bed and fell out, hitting his head and having syncope or if he had syncope prior to his fall.  - Recommend 30-day monitor at discharge to rule out arrhythmia  - Orthostatic BP were normal. Patient is normotensive  - Complete echo on 7/25 showed EF 45-50%, however was a poor quality study and the windows were challenging to fully assess wall motion  - Repeat limited echo on 11/12/22 with definity showed EF 50%  Low-normal LV function  -  Echocardiogram as above  - Hold home benazepril due to poor renal  function - Start metoprolol 25 mg daily-- this will also help slow HT as patient tachycardic at times.  - GDMT limited by BP and renal function for now, but we can try to add on at follow up       For questions or updates, please contact [facility] Please consult [email or web address] for contact info under        Signed, Margie Billet, PA-C  10/20/2021, 8:40 AM

## 2021-10-22 NOTE — Progress Notes (Deleted)
Cardiology Office Note:    Date:  10/22/2021   ID:  Justin Shaw, DOB [birth year], MRN [id number]  PCP:  Justin Infante, MD  Cardiologist:  Justin Him, MD  Electrophysiologist:  None   Referring MD: Justin Infante, MD   Chief Complaint: hospital follow-up for fall and possible syncope  History of Present Illness:    Justin Shaw is a 87 y.o. male with a history of aortic stenosis, hypertension, and hyperlipidemia who is followed by Dr. Radford Shaw and presents today for hospital follow-up of fall and possible syncope.  Patient was recently admitted from 10/17/2021 to 10/20/2021 for neck pain after falling out bed. Patient reported that for the 2 days prior to admission he had been having severe episodes of dizziness which he described as vertigo and the room spinning. He was sleeping in a recliner because he could not lie flat as that would worsen his vertigo. On the evening prior to admission, he decided to try to sleep in his bed and went to lay but then apparently fell out of bed. Patient did not remember anything about this event and it was unclear as to whether he actually loss consciousness or not. He hit is head in the process and sustained a laceration requiring suturing. He also hurt his neck. Brain MRI showed a scalp hematoma but no acute intracranial findings. MRI of C-spine showed acute ligamentous injury. Orthostatics were negative. Echo showed LVEF of 45-50% with global hypokinesis and grade 1 diastolic dysfunction as well as mild aortic stenosis but study was poor quality. Therefore, repeat limited Echo was ordered the next day and showed LVEF of 50% with definity contrast and moderate aortic stenosis. DVI of aortic valve was 0.22 which is more consistent with severe aortic stenosis. LV stroke volume index low at 23 and planimetered valve area was 1.2 cm^2. Dr. Radford Shaw personally reviewed images and felt this was more consistent with low flow low gradient moderate aortic stenosis.   Home Benazepril was stopped due to renal function and patient was started on Toprol-XL '25mg'$  daily. Outpatient 30 day monitor was ordered to rule out arrhythmia in light of possible syncopal episode.  Patient presents today for follow-up. ***  Fall Possible Syncopal Episode Patient was recently admitted after falling out of bed and there was concern that he may of had a syncopal episode although not clear. Orthostatics during admission were negative. Initial Echo showed LVEF of 45-50% with global hypokinesis and grade 1 diastolic dysfunction as well as mild aortic stenosis but study was poor quality. Therefore, repeat limited Echo was ordered the next day and showed LVEF of 50% with definity contrast and moderate aortic stenosis. Outpatient monitor was ordered.  - No recurrent falls. No clear syncope. - Will wait for monitor results. - ***  Moderate Aortic Stenosis Echo during admission showed moderate aortic stenosis. DVI of aortic valve was 0.22 which is more consistent with severe aortic stenosis. LV stroke volume index low at 23 and planimetered valve area was 1.2 cm^2. Dr. Radford Shaw personally reviewed images and felt this was more consistent with low flow low gradient moderate aortic stenosis. - Will need routine monitoring. Consider repeat Echo in 6-12 months.  Hypertension BP *** - Continue Toprol-XL '25mg'$  daily. - Previously on Benazepril but this was stopped during recent admission due to renal function. - Will repeat BMET today. ***  Hyperlipidemia - Continue Livalo '2mg'$  daily. - Followed by PCP.  CKD Stage III Creatinine during recent admission was ranged from 1.58 to  1.74. Unclear what patient's baseline is but this was felt to likely be a chronic issue rather than AKI. - Will repeat BMET today.    Past Medical History:  Diagnosis Date   Adenomatous colon polyp    Anal fissure    Chest pain    Diverticulosis    Dyslipidemia    Hypertension     Past Surgical History:   Procedure Laterality Date   ANAL FISSURE REPAIR     COLONOSCOPY  02/23/2009   INGUINAL HERNIA REPAIR     TONSILLECTOMY     TRANSTHORACIC ECHOCARDIOGRAM  2007   NORMAL LV FUNCTION, AND MILD TRACE MITRAL AND TRICUSPID REGURGITATION    Current Medications: No outpatient medications have been marked as taking for the 11/02/21 encounter (Appointment) with Justin Mclean, PA-C.     Allergies:   Crestor [rosuvastatin calcium], Hydrocodone, Hydrocodone-acetaminophen, and Oxycodone-acetaminophen   Social History   Socioeconomic History   Marital status: Married    Spouse name: Not on file   Number of children: 3   Years of education: Not on file   Highest education level: Not on file  Occupational History   Occupation: pharmacist  Tobacco Use   Smoking status: Never   Smokeless tobacco: Never  Substance and Sexual Activity   Alcohol use: Yes    Comment: occasional wine   Drug use: No   Sexual activity: Not on file  Other Topics Concern   Not on file  Social History Narrative   Not on file   Social Determinants of Health   Financial Resource Strain: Not on file  Food Insecurity: Not on file  Transportation Needs: Not on file  Physical Activity: Not on file  Stress: Not on file  Social Connections: Not on file     Family History: The patient's family history includes Diabetes in his mother; Hypertension in his mother; Stroke in his mother. There is no history of Colon cancer.  ROS:   Please see the history of present illness.     EKGs/Labs/Other Studies Reviewed:    The following studies were reviewed today:  Complete Echocardiogram 10/18/2021: Impressions:  1. Windows are challenging to fully assess wall motion. Left ventricular ejection fraction, by estimation, is 45 to 50%. The left ventricle has mildly decreased function. The left ventricle demonstrates global hypokinesis. Left ventricular diastolic parameters are consistent with Grade I diastolic dysfunction  (impaired  relaxation).   2. Right ventricular systolic function is normal. The right ventricular size is normal. There is normal pulmonary artery systolic pressure.   3. The mitral valve is grossly normal. No evidence of mitral valve regurgitation.   4. Planimetry AVA 1.2 cm2. There is moderate calcification of the aortic  valve. Aortic valve regurgitation is not visualized. Mild aortic valve stenosis.   5. There is borderline dilatation of the ascending aorta, measuring 35 mm.   6. The inferior vena cava is normal in size with greater than 50% respiratory variability, suggesting right atrial pressure of 3 mmHg.  _______________  Limited Echocardiogram 10/19/2021: Impressions: 1. Left ventricular ejection fraction, by estimation, is 50%. Left ventricular ejection fraction by 2D MOD biplane is 50.4 %. The left ventricle has low normal function.   EKG:  EKG not ordered today.   Recent Labs: 10/19/2021: Hemoglobin 11.6; Platelets 196; TSH 0.682 10/20/2021: BUN 31; Creatinine, Ser 1.74; Potassium 4.8; Sodium 136  Recent Lipid Panel No results found for: "CHOL", "TRIG", "HDL", "CHOLHDL", "VLDL", "LDLCALC", "LDLDIRECT"  Physical Exam:    Vital Signs:  There were no vitals taken for this visit.    Wt Readings from Last 3 Encounters:  10/20/21 152 lb 3.2 oz (69 kg)  07/15/19 158 lb (71.7 kg)  06/09/19 161 lb 6.4 oz (73.2 kg)     General: 87 y.o. male in no acute distress. HEENT: Normocephalic and atraumatic. Sclera clear. EOMs intact. Neck: Supple. No carotid bruits. No JVD. Heart: *** RRR. Distinct S1 and S2. No murmurs, gallops, or rubs. Radial and distal pedal pulses 2+ and equal bilaterally. Lungs: No increased work of breathing. Clear to ausculation bilaterally. No wheezes, rhonchi, or rales.  Abdomen: Soft, non-distended, and non-tender to palpation. Bowel sounds present in all 4 quadrants.  MSK: Normal strength and tone for age. *** Extremities: No lower extremity edema.    Skin:  Warm and dry. Neuro: Alert and oriented x3. No focal deficits. Psych: Normal affect. Responds appropriately.   Assessment:    No diagnosis found.  Plan:     Disposition: Follow up in ***   Medication Adjustments/Labs and Tests Ordered: Current medicines are reviewed at length with the patient today.  Concerns regarding medicines are outlined above.  No orders of the defined types were placed in this encounter.  No orders of the defined types were placed in this encounter.   There are no Patient Instructions on file for this visit.   Signed, Justin Mclean, PA-C  10/22/2021 2:57 PM    [facility]

## 2021-10-25 ENCOUNTER — Telehealth: Payer: Self-pay | Admitting: Cardiology

## 2021-10-25 NOTE — Telephone Encounter (Signed)
Patient rcvd the heart monitor but is unsure what to do with it. Please advise

## 2021-10-26 NOTE — Telephone Encounter (Signed)
LMVM- returning call regarding assistance applying cardiac event monitor. Please call Justin Shaw in monitors at [phone] to schedule an appointment to come to [facility] office to have your monitor applied.

## 2021-10-27 ENCOUNTER — Ambulatory Visit (INDEPENDENT_AMBULATORY_CARE_PROVIDER_SITE_OTHER): Payer: Medicare Other

## 2021-10-27 DIAGNOSIS — R55 Syncope and collapse: Secondary | ICD-10-CM

## 2021-11-01 NOTE — Progress Notes (Addendum)
Cardiology Office Note:    Date:  11/04/2021   ID:  Justin Shaw, DOB [birth year], MRN [id number]  PCP:  Justin Infante, MD   [facility] Cardiologist:  Justin Him, MD     Referring MD: Justin Infante, MD    CC: Here for follow up of recent fall out of bed  History of Present Illness:    Justin Shaw is a 87 y.o. male with a hx of the following:  Hypertension Hyperlipidemia Recent fall Nonrheumatic aortic valve stenosis, mild Hx of CKD stage 3, AKI from hospital stay 03/256 Systolic CHF, LVEF 52-77%, improved to 50% on limited echo on 09/2021 Acute sprain of ligament of neck, head injury Dysphagia  Presented to the ED on 10/17/2021.  Stated he had a dizzy spell for 2 days and has had this before.  He slept in a recliner for 2 nights because he is unable to lie flat at night due to vertigo.  Stated he got into bed and fell out of bed and does not remember anything about it.  He thinks that this happened shortly after he got up.  Denied injuring his head or neck.  Denies any other past history of syncope however in the past he got knocked out while playing football.  During his ED course his forehead laceration was repaired.  Troponins were negative.  MRI brain showed a scalp hematoma but no acute finding.  MRI of the C-spine showed evidence of acute ligamentous injury.  Labs including WBC 11.4, hemoglobin 12.4, creatinine 1.7.  Neurosurgery was consulted and recommended c-collar and outpatient follow-up.  No neurological deficits.  He was at moderate/high risk for serious outcome and he was observed overnight on telemetry in the hospital.  2D echocardiogram revealed LVEF of 45 to 50%.  Grade 1 diastolic dysfunction, Planimetry AVA 1.2 cm2, mild aortic valve stenosis, moderate calcification of aortic valve, borderline dilatation of ascending aorta measuring 35 mm, normal IVC.  Cardiology was consulted for what internal medicine called systolic congestive heart  failure. Was started on metoprolol, it was recommended he follow-up outpatient.  He was euvolemic during his hospital stay.  He was also in persistent mild sinus tachycardia, TSH was normal, it was decided to continue Shaw on metoprolol.  Benazepril was currently on hold due to creatinine ranging from 1.6-1.7.  No hydronephrosis was seen per renal ultrasound.  Was recommended to follow-up with neurology in 1 to 2 weeks as well as neurosurgery.  Nonproductive cough noted, chest x-ray showed possible atelectasis, continue Robitussin and incentive spirometer.  Follow-up with home health PT and OT.   He presents today for outpatient evaluation from his hospital discharge on October 20, 2021. He states he has been doing well from a cardiac perspective after his recent hospitalization for his recent fall.  Denies any chest pain, shortness of breath, palpitations, orthopnea or PND, swelling, weight changes, bleeding, or claudication.  Denied any orthostatic dizziness but says has a history of vertigo. It is questionable that this was true syncope when it sounds like patient fell out of bed. He stated that laying down in bed, he turned over, fell out of the bed and hit his head and injured his neck.  Says he does not remember this event at all.  Does have some short-term memory loss surrounding this event.  Says he will be following up with neurosurgery later today and continues to wear his c-collar.  Denies any orthostatic blood pressure readings at home.  Wife who  is present in the room with Shaw says his SBP readings have been in the 140s to 150s, patient admits to whitecoat hypertension.  Works as a Software engineer.  Will be following up with his PCP in September for annual physical.  Denies any history of iron deficiency anemia, but wife states he tends to remain cold.  I suggested that he follow-up with PCP to include iron panel to rule out any iron deficiency anemia. Denies any other questions or concerns today.  Past  Medical History:  Diagnosis Date   Adenomatous colon polyp    Anal fissure    Chest pain    Chronic kidney disease    Diverticulosis    Dyslipidemia    Hypertension     Past Surgical History:  Procedure Laterality Date   ANAL FISSURE REPAIR     COLONOSCOPY  02/23/2009   INGUINAL HERNIA REPAIR     TONSILLECTOMY     TRANSTHORACIC ECHOCARDIOGRAM  2007   NORMAL LV FUNCTION, AND MILD TRACE MITRAL AND TRICUSPID REGURGITATION    Current Medications: Current Meds  Medication Sig   ALPRAZolam (XANAX) 0.5 MG tablet Take 0.5-1 mg by mouth at bedtime as needed for anxiety or sleep.   b complex vitamins capsule Take 1 capsule by mouth daily.   Cholecalciferol (VITAMIN D3) 50 MCG (2000 UT) capsule Take 1 capsule by mouth daily.    Coenzyme Q10 200 MG capsule Take 200 mg by mouth daily.   fluticasone (FLONASE) 50 MCG/ACT nasal spray Place 1 spray into both nostrils daily.   guaiFENesin-dextromethorphan (ROBITUSSIN DM) 100-10 MG/5ML syrup Take 10 mLs by mouth every 4 (four) hours as needed for cough.   LIVALO 2 MG TABS Take 1 tablet by mouth daily.    Magnesium 250 MG TABS Take 250 mg by mouth daily.   mupirocin ointment (BACTROBAN) 2 % Apply topically as needed.   [DISCONTINUED] Glucosamine 500 MG CAPS Take 500 mg by mouth daily.   [DISCONTINUED] metoprolol succinate (TOPROL-XL) 25 MG 24 hr tablet Take 1 tablet (25 mg total) by mouth daily.     Allergies:   Crestor [rosuvastatin calcium], Hydrocodone, Hydrocodone-acetaminophen, and Oxycodone-acetaminophen   Social History   Socioeconomic History   Marital status: Married    Spouse name: Not on file   Number of children: 3   Years of education: Not on file   Highest education level: Not on file  Occupational History   Occupation: pharmacist  Tobacco Use   Smoking status: Never   Smokeless tobacco: Never  Substance and Sexual Activity   Alcohol use: Yes    Comment: occasional wine   Drug use: No   Sexual activity: Not on file   Other Topics Concern   Not on file  Social History Narrative   Not on file   Social Determinants of Health   Financial Resource Strain: Not on file  Food Insecurity: Not on file  Transportation Needs: Not on file  Physical Activity: Not on file  Stress: Not on file  Social Connections: Not on file     Family History: The patient's family history includes Diabetes in his mother; Hypertension in his mother; Stroke in his mother. There is no history of Colon cancer.  ROS:   Review of Systems  Constitutional:  Positive for chills. Negative for diaphoresis, fever, malaise/fatigue and weight loss.  HENT: Negative.    Eyes: Negative.   Respiratory: Negative.    Cardiovascular: Negative.   Gastrointestinal: Negative.   Genitourinary:  Positive for frequency.  Negative for dysuria, flank pain, hematuria and urgency.  Musculoskeletal:  Positive for falls and neck pain. Negative for back pain, joint pain and myalgias.  Skin:        Scalp laceration - left lateral side. See HPI.   Neurological:  Positive for dizziness. Negative for tingling, tremors, sensory change, speech change, focal weakness, seizures, loss of consciousness, weakness and headaches.       Hx of vertigo - see HPI.   Endo/Heme/Allergies: Negative.   Psychiatric/Behavioral: Negative.     Please see the history of present illness.    All other systems reviewed and are negative.  EKGs/Labs/Other Studies Reviewed:    The following studies were reviewed today:   EKG:  EKG is not ordered today. Pt is in NSR with HR of 84 bpm, S1/S2 noted, RRR.   30 day Cardiac event monitor is pending  2D Limited echo on October 19, 2021: Left ventricle ejection fraction is 50%.  Left ventricular ejection fraction by 2D MOD biplane is 50.4%.  Low normal function of left ventricle.  2D complete echo on October 18, 2021: LVEF is 45 to 50%.  Left ventricle demonstrates global hypokinesis.  Grade 1 diastolic dysfunction. Planimetry AVA 1.2  cm2.  Moderate calcification of aortic valve, mild aortic stenosis, no evidence of AVR.  Borderline dilatation of ascending aorta, measuring 3 to 5 mm.  All other findings normal.   Vascular ultrasound lower extremity arterial on June 23, 2019: Resting right ABI is normal.  No evidence of significant right lower extremity arterial disease.  Right toe brachial index is normal. Left ABI is within normal range.  No evidence of significant left lower extremity arterial disease.  Left toe brachial index is normal.    Recent Labs: 10/19/2021: Hemoglobin 11.6; Platelets 196; TSH 0.682 11/02/2021: BUN 35; Creatinine, Ser 1.65; Magnesium 2.2; Potassium 5.5; Sodium 138  Recent Lipid Panel No results found for: "CHOL", "TRIG", "HDL", "CHOLHDL", "VLDL", "LDLCALC", "LDLDIRECT"        Physical Exam:    VS:  BP (!) 142/79   Pulse 84   Ht $R'5\' 10"'yv$  (1.778 m)   Wt 155 lb (70.3 kg)   SpO2 98%   BMI 22.24 kg/m     Wt Readings from Last 3 Encounters:  11/04/21 154 lb (69.9 kg)  11/02/21 155 lb (70.3 kg)  10/20/21 152 lb 3.2 oz (69 kg)     GEN: Well nourished, well developed in no acute distress HEENT: laceration noted to right lateral scalp with steri strips noted. Healing well, without any hematoma, bruising, erythema, or redness. Wearing C collar, otherwise normal NECK: unable to assess for JVD or carotid bruits as he has C collar on.  CARDIAC: RRR, no murmurs, rubs, gallops; 2+ peripheral pulses noted throughout, strong and equal bilaterally RESPIRATORY:  Clear and diminished to auscultation without rales, wheezing or rhonchi  MUSCULOSKELETAL:  No edema; No deformity  SKIN: Warm and dry NEUROLOGIC:  Alert and oriented x 3, some mild short-term memory loss surrounding recent syncope event PSYCHIATRIC:  Normal affect   ASSESSMENT:    1. History of recent fall   2. Essential hypertension, benign   3. Left ventricular dysfunction   4. Stage 3b chronic kidney disease ([place])   5. Hyperlipidemia,  unspecified hyperlipidemia type   6. Mild aortic valve stenosis   7. Calcification of aortic valve    PLAN:    In order of problems listed above:  History of recent fall - progressing Etiology is unclear.  Does not appear to be due to orthostatic hypotension, as readings at home were not significant enough for orthostatic hypotension. Recent electrolytes checked at hospital were stable. Will obtain Magnesium level and BMET today.  He has not had a Carotid Doppler done in the past, so we will arrange this for Shaw.  Currently wearing cardiac monitor and we will evaluate for any arrhythmias.  Discussed monitoring blood pressure including regular blood pressures and orthostatics at home and he will let me know via MyChart in 2 weeks.   2.  Hypertension-chronic, stable Initial blood pressure 152/90 and recheck by me was 142/79.  Does admit to whitecoat hypertension.  However wife does state blood pressures have been elevated at home with SBPs in the 140s to 150s.  Discussed monitoring blood pressures at home as mentioned above and was given blood pressure log.  If blood pressures continue to remain in the 140s and 150s we will consider increasing metoprolol succinate from 25 mg daily to 37.5 mg daily.  Will obtain BMET as mentioned above.   3. LV Dysfunction, with recent EF of 50% - chronic, stable Has not had previous echocardiogram on file however initial 2D echocardiogram in the hospital revealed LVEF of 45-50%, with repeat limited echo revealed EF of 50%.  Low normal function of left ventricle. Euvolemic and well compensated on exam. Patient does not have systolic heart failure, but does have LV dysfunction. Continue metoprolol succinate 25 mg daily. Obtain BMET today.   4. CKD stage 3b - chronic, stable sCr have been trending 1.68-1.74 with eGFR 38-40. Home med ace inhibitor was d/c to improve renal function. Will obtain BMET today. If renal function remains the same or worse, may consider referral  to Nephrology. Encouraged adequate hydration.   5. HLD - chronic, stable This is being managed by his PCP.  He will have annual labs checked in September.  Currently being treated with Livalo 2 mg daily. He has a prior hx of statin intolerance to Crestor.   6. Mild aortic valve stenosis with moderate calcification of aortic valve - chronic, stable Seen on 2D complete echo on 10/18/2021. No murmur, rub, or gallop noted on exam. RRR with S1/S2 noted. He is asymptomatic with this and will recommend serial echocardiograms every 3 - 5 years. Next echocardiogram should be done in 2026 or sooner if indicated. Will continue to monitor.   7. Disposition: Follow up in 2 months or sooner if anything changes.       Medication Adjustments/Labs and Tests Ordered: Current medicines are reviewed at length with the patient today.  Concerns regarding medicines are outlined above.  Orders Placed This Encounter  Procedures   Basic metabolic panel   Magnesium   VAS US CAROTID   No orders of the defined types were placed in this encounter.   Patient Instructions  Medication Instructions:  Your Physician recommend you continue on your current medication as directed.    *If you need a refill on your cardiac medications before your next appointment, please call your pharmacy*   Lab Work: Your physician recommends lab work today (BMP, Mg)  If you have labs (blood work) drawn today and your tests are completely normal, you will receive your results only by: MyChart Message (if you have MyChart) OR A paper copy in the mail If you have any lab test that is abnormal or we need to change your treatment, we will call you to review the results.   Testing/Procedures: Your physician has requested that  you have a carotid duplex. This test is an ultrasound of the carotid arteries in your neck. It looks at blood flow through these arteries that supply the brain with blood. Allow one hour for this exam. There are no  restrictions or special instructions. [place]   Follow-Up: At [facility], you and your health needs are our priority.  As part of our continuing mission to provide you with exceptional heart care, we have created designated Provider Care Teams.  These Care Teams include your primary Cardiologist (physician) and Advanced Practice Providers (APPs -  Physician Assistants and Nurse Practitioners) who all work together to provide you with the care you need, when you need it.  We recommend signing up for the patient portal called "MyChart".  Sign up information is provided on this After Visit Summary.  MyChart is used to connect with patients for Virtual Visits (Telemedicine).  Patients are able to view lab/test results, encounter notes, upcoming appointments, etc.  Non-urgent messages can be sent to your provider as well.   To learn more about what you can do with MyChart, go to [email or web address].    Your next appointment:   2 month(s)  The format for your next appointment:   In Person  Provider: Sande Rives, PA or Finis Bud, NP  Please monitor blood pressure daily and provide an update in 2 weeks          Signed, Finis Bud, NP  11/04/2021 8:31 PM    [place]

## 2021-11-02 ENCOUNTER — Encounter: Payer: Self-pay | Admitting: Nurse Practitioner

## 2021-11-02 ENCOUNTER — Telehealth: Payer: Self-pay | Admitting: Student

## 2021-11-02 ENCOUNTER — Ambulatory Visit: Payer: Medicare Other | Admitting: Nurse Practitioner

## 2021-11-02 VITALS — BP 142/79 | HR 84 | Ht 70.0 in | Wt 155.0 lb

## 2021-11-02 DIAGNOSIS — I519 Heart disease, unspecified: Secondary | ICD-10-CM | POA: Diagnosis not present

## 2021-11-02 DIAGNOSIS — I10 Essential (primary) hypertension: Secondary | ICD-10-CM

## 2021-11-02 DIAGNOSIS — N1832 Chronic kidney disease, stage 3b: Secondary | ICD-10-CM | POA: Diagnosis not present

## 2021-11-02 DIAGNOSIS — I359 Nonrheumatic aortic valve disorder, unspecified: Secondary | ICD-10-CM

## 2021-11-02 DIAGNOSIS — E785 Hyperlipidemia, unspecified: Secondary | ICD-10-CM

## 2021-11-02 DIAGNOSIS — Z9181 History of falling: Secondary | ICD-10-CM | POA: Diagnosis not present

## 2021-11-02 DIAGNOSIS — I350 Nonrheumatic aortic (valve) stenosis: Secondary | ICD-10-CM

## 2021-11-02 NOTE — Telephone Encounter (Signed)
Justin Shaw saw him today so I will pass this along to her.

## 2021-11-02 NOTE — Telephone Encounter (Signed)
Patient informed of reply from E. [place] Calix, NP.Marland KitchenMarland Kitchen"Patient okay to go back to work as long as it is cleared from neurosurgery. However, he should not drive until we get the results back from the heart monitor to evaluate for any arrhythmias." Patient had a question about driving. Explained to patient that until he hears from cardiology, he should not drive. He verbalized understanding.

## 2021-11-02 NOTE — Telephone Encounter (Signed)
Patient okay to go back to work as long as it is cleared from neurosurgery. However, he should not drive until we get the results back from the heart monitor to evaluate for any arrhythmias.   Thanks so much!   Kind Regards,  Finis Bud, NP

## 2021-11-02 NOTE — Telephone Encounter (Signed)
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

## 2021-11-02 NOTE — Patient Instructions (Signed)
Medication Instructions:  Your Physician recommend you continue on your current medication as directed.    *If you need a refill on your cardiac medications before your next appointment, please call your pharmacy*   Lab Work: Your physician recommends lab work today (BMP, Mg)  If you have labs (blood work) drawn today and your tests are completely normal, you will receive your results only by: MyChart Message (if you have MyChart) OR A paper copy in the mail If you have any lab test that is abnormal or we need to change your treatment, we will call you to review the results.   Testing/Procedures: Your physician has requested that you have a carotid duplex. This test is an ultrasound of the carotid arteries in your neck. It looks at blood flow through these arteries that supply the brain with blood. Allow one hour for this exam. There are no restrictions or special instructions. [place]. [address]   Follow-Up: At [facility], you and your health needs are our priority.  As part of our continuing mission to provide you with exceptional heart care, we have created designated Provider Care Teams.  These Care Teams include your primary Cardiologist (physician) and Advanced Practice Providers (APPs -  Physician Assistants and Nurse Practitioners) who all work together to provide you with the care you need, when you need it.  We recommend signing up for the patient portal called "MyChart".  Sign up information is provided on this After Visit Summary.  MyChart is used to connect with patients for Virtual Visits (Telemedicine).  Patients are able to view lab/test results, encounter notes, upcoming appointments, etc.  Non-urgent messages can be sent to your provider as well.   To learn more about what you can do with MyChart, go to [email or web address].    Your next appointment:   2 month(s)  The format for your next appointment:   In Person  Provider: Sande Rives, PA or  Finis Bud, NP  Please monitor blood pressure daily and provide an update in 2 weeks

## 2021-11-02 NOTE — Telephone Encounter (Signed)
Patient wants to make sure he can go back to work with out restrictions.

## 2021-11-03 ENCOUNTER — Other Ambulatory Visit: Payer: Self-pay

## 2021-11-03 DIAGNOSIS — E875 Hyperkalemia: Secondary | ICD-10-CM

## 2021-11-03 LAB — BASIC METABOLIC PANEL
BUN/Creatinine Ratio: 21 (ref 10–24)
BUN: 35 mg/dL — ABNORMAL HIGH (ref 8–27)
CO2: 25 mmol/L (ref 20–29)
Calcium: 9.7 mg/dL (ref 8.6–10.2)
Chloride: 99 mmol/L (ref 96–106)
Creatinine, Ser: 1.65 mg/dL — ABNORMAL HIGH (ref 0.76–1.27)
Glucose: 95 mg/dL (ref 70–99)
Potassium: 5.5 mmol/L — ABNORMAL HIGH (ref 3.5–5.2)
Sodium: 138 mmol/L (ref 134–144)
eGFR: 40 mL/min/{1.73_m2} — ABNORMAL LOW (ref >59)

## 2021-11-03 LAB — MAGNESIUM: Magnesium: 2.2 mg/dL (ref 1.6–2.3)

## 2021-11-04 ENCOUNTER — Encounter: Payer: Self-pay | Admitting: Cardiovascular Disease

## 2021-11-04 ENCOUNTER — Ambulatory Visit (INDEPENDENT_AMBULATORY_CARE_PROVIDER_SITE_OTHER): Payer: Medicare Other | Admitting: Cardiovascular Disease

## 2021-11-04 ENCOUNTER — Encounter (HOSPITAL_COMMUNITY): Payer: Medicare Other

## 2021-11-04 ENCOUNTER — Telehealth: Payer: Self-pay | Admitting: Nurse Practitioner

## 2021-11-04 ENCOUNTER — Ambulatory Visit (HOSPITAL_COMMUNITY)
Admission: RE | Admit: 2021-11-04 | Discharge: 2021-11-04 | Disposition: A | Discharge status: Home / Self Care | Payer: Medicare Other | Source: Ambulatory Visit | Attending: Cardiovascular Disease | Admitting: Cardiovascular Disease

## 2021-11-04 VITALS — BP 150/84 | HR 80 | Ht 70.0 in | Wt 154.0 lb

## 2021-11-04 DIAGNOSIS — Z9181 History of falling: Secondary | ICD-10-CM | POA: Diagnosis not present

## 2021-11-04 DIAGNOSIS — R55 Syncope and collapse: Secondary | ICD-10-CM

## 2021-11-04 NOTE — Progress Notes (Signed)
Cardiology Office Note:   Date:  11/04/2021  NAME:  Justin Shaw    MRN: [id number] DOB:  [birth year]   PCP:  Crist Infante, MD  Cardiologist:  Fransico Him, MD  Electrophysiologist:  None   Referring MD: Crist Infante, MD   Chief Complaint  Patient presents with   Loss of Consciousness   History of Present Illness:   Justin Shaw is a 87 y.o. male with a hx of mild AS, systolic HF with recovery of EF, HTN, CKD IIIb who presents for evaluation of syncope during carotid ultrasound study. He was laying back for carotid ultrasounds and got dizzy. He reports he briefly passed out.  He reports no chest pain or trouble breathing.  He came to very quickly.  Carotid ultrasound showed minimal disease.  He has had similar episodes of this.  He was actually seen in the hospital on 10/19/2021 for similar episode.  Apparently he was in bed and rolled over became dizzy and fell off the bed.  He suffered ligamentous injury of the cervical neck.  He has recently been cleared from his c-collar.  He is currently wearing a monitor.  His EKG today demonstrates normal sinus rhythm.  His examination is really unremarkable.  He has a faint systolic murmur.  He has no signs of congestive heart failure.  He reports he is drinking plenty of water and eating well.  Symptoms appear to occur with position change and laying down.  To me this likely represents an inner ear issue and not a cardiac problem.  He has no symptoms currently.  Again his symptoms were brief.  His EKG is normal and his CV exam is normal.  Past Medical History: Past Medical History:  Diagnosis Date   Adenomatous colon polyp    Anal fissure    Chest pain    Chronic kidney disease    Diverticulosis    Dyslipidemia    Hypertension     Past Surgical History: Past Surgical History:  Procedure Laterality Date   ANAL FISSURE REPAIR     COLONOSCOPY  02/23/2009   INGUINAL HERNIA REPAIR     TONSILLECTOMY     TRANSTHORACIC ECHOCARDIOGRAM  2007    NORMAL LV FUNCTION, AND MILD TRACE MITRAL AND TRICUSPID REGURGITATION    Current Medications: Current Meds  Medication Sig   ALPRAZolam (XANAX) 0.5 MG tablet Take 0.5-1 mg by mouth at bedtime as needed for anxiety or sleep.   b complex vitamins capsule Take 1 capsule by mouth daily.   Cholecalciferol (VITAMIN D3) 50 MCG (2000 UT) capsule Take 1 capsule by mouth daily.    Coenzyme Q10 200 MG capsule Take 200 mg by mouth daily.   fluticasone (FLONASE) 50 MCG/ACT nasal spray Place 1 spray into both nostrils daily.   guaiFENesin-dextromethorphan (ROBITUSSIN DM) 100-10 MG/5ML syrup Take 10 mLs by mouth every 4 (four) hours as needed for cough.   LIVALO 2 MG TABS Take 1 tablet by mouth daily.    Magnesium 250 MG TABS Take 250 mg by mouth daily.   metoprolol succinate (TOPROL XL) 25 MG 24 hr tablet Take 25 mg 1&1/2 tablets daily   mupirocin ointment (BACTROBAN) 2 % Apply topically as needed.     Allergies:    Crestor [rosuvastatin calcium], Hydrocodone, Hydrocodone-acetaminophen, and Oxycodone-acetaminophen   Social History: Social History   Socioeconomic History   Marital status: Married    Spouse name: Not on file   Number of children: 3   Years of education:  Not on file   Highest education level: Not on file  Occupational History   Occupation: pharmacist  Tobacco Use   Smoking status: Never   Smokeless tobacco: Never  Substance and Sexual Activity   Alcohol use: Yes    Comment: occasional wine   Drug use: No   Sexual activity: Not on file  Other Topics Concern   Not on file  Social History Narrative   Not on file   Social Determinants of Health   Financial Resource Strain: Not on file  Food Insecurity: Not on file  Transportation Needs: Not on file  Physical Activity: Not on file  Stress: Not on file  Social Connections: Not on file     Family History: The patient's family history includes Diabetes in his mother; Hypertension in his mother; Stroke in his mother.  There is no history of Colon cancer.  ROS:   All other ROS reviewed and negative. Pertinent positives noted in the HPI.     EKGs/Labs/Other Studies Reviewed:   The following studies were personally reviewed by me today:  EKG:  EKG is ordered today.  The ekg ordered today demonstrates normal sinus rhythm heart rate 80, no acute ischemic changes or evidence of infarction, and was personally reviewed by me.   TTE 10/19/2021  1. Left ventricular ejection fraction, by estimation, is 50%. Left  ventricular ejection fraction by 2D MOD biplane is 50.4 %. The left  ventricle has low normal function.   Recent Labs: 10/19/2021: Hemoglobin 11.6; Platelets 196; TSH 0.682 11/02/2021: BUN 35; Creatinine, Ser 1.65; Magnesium 2.2; Potassium 5.5; Sodium 138   Recent Lipid Panel No results found for: "CHOL", "TRIG", "HDL", "CHOLHDL", "VLDL", "LDLCALC", "LDLDIRECT"  Physical Exam:   VS:  BP (!) 150/84   Pulse 80   Ht '5\' 10"'$  (1.778 m)   Wt 154 lb (69.9 kg)   SpO2 96%   BMI 22.10 kg/m    Wt Readings from Last 3 Encounters:  11/04/21 154 lb (69.9 kg)  11/02/21 155 lb (70.3 kg)  10/20/21 152 lb 3.2 oz (69 kg)    General: Well nourished, well developed, in no acute distress Head: Atraumatic, normal size  Eyes: PEERLA, EOMI  Neck: Supple, no JVD Endocrine: No thryomegaly Cardiac: Normal S1, S2; RRR; 2 out of 6 systolic ejection Lungs: Clear to auscultation bilaterally, no wheezing, rhonchi or rales  Abd: Soft, nontender, no hepatomegaly  Ext: No edema, pulses 2+ Musculoskeletal: No deformities, BUE and BLE strength normal and equal Skin: Warm and dry, no rashes   Neuro: Alert and oriented to person, place, time, and situation, CNII-XII grossly intact, no focal deficits  Psych: Normal mood and affect   ASSESSMENT:   Justin Shaw is a 87 y.o. male who presents for the following: 1. Syncope and collapse     PLAN:   1. Syncope and collapse -He suffered a syncopal event in the office today.   This occurred with laying flat and moving his head.  This appears to be positional.  He had a similar episode in the hospital in late July.  His echocardiogram shows normal LV function.  His EKG shows normal sinus rhythm with no acute ischemic changes.  He did complete carotid ultrasounds which showed minimal disease.  He describes no chest pain or trouble breathing with the episodes.  They clearly are related to change in position of his head.  To me this likely represents an inner ear issue.  He will complete his heart monitor.  His symptoms have resolved.  He is back to normal.  Thyroid studies are normal.  All of his other symptoms have resolved.  His blood pressure is stable.  Would recommend he complete his heart monitor and likely see an ENT doctor for evaluation of vestibular therapy.  He may benefit from this.  He is stable for discharge today.  He understands he cannot drive until his work-up has been completed.  Disposition: Return if symptoms worsen or fail to improve.  Medication Adjustments/Labs and Tests Ordered: Current medicines are reviewed at length with the patient today.  Concerns regarding medicines are outlined above.  Orders Placed This Encounter  Procedures   EKG 12-Lead   No orders of the defined types were placed in this encounter.   Patient Instructions  Medication Instructions:  The current medical regimen is effective;  continue present plan and medications.  *If you need a refill on your cardiac medications before your next appointment, please call your pharmacy*   Follow-Up: At [facility], you and your health needs are our priority.  As part of our continuing mission to provide you with exceptional heart care, we have created designated Provider Care Teams.  These Care Teams include your primary Cardiologist (physician) and Advanced Practice Providers (APPs -  Physician Assistants and Nurse Practitioners) who all work together to provide you with the care you  need, when you need it.  We recommend signing up for the patient portal called "MyChart".  Sign up information is provided on this After Visit Summary.  MyChart is used to connect with patients for Virtual Visits (Telemedicine).  Patients are able to view lab/test results, encounter notes, upcoming appointments, etc.  Non-urgent messages can be sent to your provider as well.   To learn more about what you can do with MyChart, go to [email or web address].    Your next appointment:   As scheduled   The format for your next appointment:   In Person  Provider:   Finis Bud, NP            Time Spent with Patient: I have spent a total of 25 minutes with patient reviewing hospital notes, telemetry, EKGs, labs and examining the patient as well as establishing an assessment and plan that was discussed with the patient.  > 50% of time was spent in direct patient care.  Signed, Addison Naegeli. Audie Box, MD, [facility]  [address] [place], [place] [address] [phone]  11/04/2021 4:52 PM

## 2021-11-04 NOTE — Telephone Encounter (Signed)
S/W patient.  He is doing very well and denies any concerns or questions.  He knows he will be coming by today for a repeat BMET to assess potassium level and kidney function.  He has stopped taking glucosamine as directed.  He is avoiding foods rich in potassium and is not taking any potassium supplements.  He contacted our office previously stating his blood pressure readings were still elevated in the 150s.  He is taking metoprolol succinate 37.5 mg daily as advised and directed.  I have updated nursing staff regarding this medication change.  He says he will be continuing to monitor his blood pressure with this change in blood pressure medication and will let us know his readings in the future.  Stating he is trying to stay adequately-hydrated to help improve kidney function.  I notified patient that if kidney function continues to remain elevated, we may need to consider referral to nephrology. He verbalized understanding and was appreciative of the call.  Finis Bud

## 2021-11-04 NOTE — Patient Instructions (Addendum)
Medication Instructions:  The current medical regimen is effective;  continue present plan and medications.  *If you need a refill on your cardiac medications before your next appointment, please call your pharmacy*   Follow-Up: At [facility], you and your health needs are our priority.  As part of our continuing mission to provide you with exceptional heart care, we have created designated Provider Care Teams.  These Care Teams include your primary Cardiologist (physician) and Advanced Practice Providers (APPs -  Physician Assistants and Nurse Practitioners) who all work together to provide you with the care you need, when you need it.  We recommend signing up for the patient portal called "MyChart".  Sign up information is provided on this After Visit Summary.  MyChart is used to connect with patients for Virtual Visits (Telemedicine).  Patients are able to view lab/test results, encounter notes, upcoming appointments, etc.  Non-urgent messages can be sent to your provider as well.   To learn more about what you can do with MyChart, go to [email or web address].    Your next appointment:   As scheduled   The format for your next appointment:   In Person  Provider:   Finis Bud, NP

## 2021-11-04 NOTE — Addendum Note (Signed)
Addended by: Kathyrn Lass on: 11/04/2021 09:48 AM   Modules accepted: Orders

## 2021-11-05 LAB — BASIC METABOLIC PANEL
BUN/Creatinine Ratio: 19 (ref 10–24)
BUN: 30 mg/dL — ABNORMAL HIGH (ref 8–27)
CO2: 24 mmol/L (ref 20–29)
Calcium: 9.3 mg/dL (ref 8.6–10.2)
Chloride: 99 mmol/L (ref 96–106)
Creatinine, Ser: 1.59 mg/dL — ABNORMAL HIGH (ref 0.76–1.27)
Glucose: 98 mg/dL (ref 70–99)
Potassium: 5.3 mmol/L — ABNORMAL HIGH (ref 3.5–5.2)
Sodium: 138 mmol/L (ref 134–144)
eGFR: 42 mL/min/{1.73_m2} — ABNORMAL LOW (ref >59)

## 2021-11-08 ENCOUNTER — Other Ambulatory Visit: Payer: Self-pay | Admitting: *Deleted

## 2021-11-08 ENCOUNTER — Encounter: Payer: Self-pay | Admitting: *Deleted

## 2021-11-08 DIAGNOSIS — E875 Hyperkalemia: Secondary | ICD-10-CM

## 2021-11-11 ENCOUNTER — Other Ambulatory Visit: Payer: Self-pay

## 2021-11-11 DIAGNOSIS — E875 Hyperkalemia: Secondary | ICD-10-CM

## 2021-11-11 LAB — BASIC METABOLIC PANEL
BUN/Creatinine Ratio: 20 (ref 10–24)
BUN: 32 mg/dL — ABNORMAL HIGH (ref 8–27)
CO2: 24 mmol/L (ref 20–29)
Calcium: 9.7 mg/dL (ref 8.6–10.2)
Chloride: 101 mmol/L (ref 96–106)
Creatinine, Ser: 1.61 mg/dL — ABNORMAL HIGH (ref 0.76–1.27)
Glucose: 95 mg/dL (ref 70–99)
Potassium: 4.8 mmol/L (ref 3.5–5.2)
Sodium: 139 mmol/L (ref 134–144)
eGFR: 42 mL/min/{1.73_m2} — ABNORMAL LOW (ref >59)

## 2021-11-14 ENCOUNTER — Telehealth: Payer: Self-pay | Admitting: Nurse Practitioner

## 2021-11-14 NOTE — Telephone Encounter (Signed)
Called and s/w pt on 11/11/21. He is doing well. BP is well controlled. Says he cannot lay flat while sleeping. Told him to follow up with his PCP who can refer him for PT for what sounds like vertigo symptoms. Updated him regarding his blood work and his potassium level has normalized. Encouraged him to follow up with his kidney doctor. Recent BP log show improvement in his SBP from the 150's to mid to low 130's. Discussed to continue to monitor BP and if his symptoms change or worsen to let us know. He verbalized understanding.   Finis Bud, NP

## 2021-11-29 ENCOUNTER — Ambulatory Visit: Payer: Medicare Other | Attending: Internal Medicine | Admitting: Physical Therapy

## 2021-11-29 ENCOUNTER — Other Ambulatory Visit: Payer: Self-pay

## 2021-11-29 ENCOUNTER — Encounter: Payer: Self-pay | Admitting: Physical Therapy

## 2021-11-29 DIAGNOSIS — R2681 Unsteadiness on feet: Secondary | ICD-10-CM | POA: Diagnosis present

## 2021-11-29 DIAGNOSIS — H8112 Benign paroxysmal vertigo, left ear: Secondary | ICD-10-CM | POA: Insufficient documentation

## 2021-11-29 NOTE — Therapy (Signed)
OUTPATIENT PHYSICAL THERAPY VESTIBULAR EVALUATION     Patient Name: Justin Shaw MRN: [id number] DOB:[birth year], 87 y.o., male Today's Date: 11/29/2021  PCP: Crist Infante, MD REFERRING PROVIDER: Crist Infante, MD   PT End of Session - 11/29/21 1536     Visit Number 1    Number of Visits 9    Date for PT Re-Evaluation 12/27/21    Authorization Type [employer] Medicare    PT Start Time 1405    PT Stop Time 6010    PT Time Calculation (min) 40 min    Equipment Utilized During Treatment Gait belt    Activity Tolerance Patient tolerated treatment well;Patient limited by pain    Behavior During Therapy WFL for tasks assessed/performed             Past Medical History:  Diagnosis Date   Adenomatous colon polyp    Anal fissure    Chest pain    Chronic kidney disease    Diverticulosis    Dyslipidemia    Hypertension    Past Surgical History:  Procedure Laterality Date   ANAL FISSURE REPAIR     COLONOSCOPY  02/23/2009   INGUINAL HERNIA REPAIR     TONSILLECTOMY     TRANSTHORACIC ECHOCARDIOGRAM  2007   NORMAL LV FUNCTION, AND MILD TRACE MITRAL AND TRICUSPID REGURGITATION   Patient Active Problem List   Diagnosis Date Noted   AKI (acute kidney injury) ([place])    Nonrheumatic aortic valve stenosis    Syncope 10/18/2021   Acute sprain of ligament of neck 10/18/2021   Renal dysfunction 10/18/2021   Essential hypertension 10/18/2021   Dysphagia 10/18/2021   DNR (do not resuscitate) 10/18/2021   Special screening for malignant neoplasms, colon 01/25/2011   Benign neoplasm of colon 01/25/2011   Diverticulosis of colon (without mention of hemorrhage) 01/25/2011   Chest pain 09/16/2010   Hyperlipidemia 09/16/2010    ONSET DATE: 10/17/21  REFERRING DIAG: R42 (ICD-10-CM) - Dizziness and giddiness  THERAPY DIAG:  BPPV (benign paroxysmal positional vertigo), left  Unsteadiness on feet  Rationale for Evaluation and Treatment Rehabilitation  SUBJECTIVE:   SUBJECTIVE  STATEMENT: Patient reports 3 episodes of dizziness within the past couple of months. All episodes occurred either in bed or on a medical table while getting carotid testing. Reports that he injured his neck but did not need surgery. Has been sleeping in a recliner because he notices that when she lays flat is when he gets dizzy. Reports that he was in a cervical collar for 15 days; was then cleared by the surgeon for normal movement and activity. Reports that he had a gash on his forehead from the fall. Had HHPT after his recent hospitalization. Denies infection/illness, vision changes/double vision, hearing loss, tinnitus, otalgia, migraines. Reports "a lot of wax in my ears and have to get them cleaned every 3 weeks."   Pt accompanied by: self  PERTINENT HISTORY: CKD, HLD, HTN, systolic heart failure    PAIN:  Are you having pain? No  PRECAUTIONS: Fall  WEIGHT BEARING RESTRICTIONS No  FALLS: Has patient fallen in last 6 months? Yes. Number of falls 2 falls out of bed d/t dizziness  LIVING ENVIRONMENT: Lives with: lives with their spouse Lives in: House/apartment Stairs:  1 story home Has following equipment at home: Single point cane and Environmental consultant - 2 wheeled  PLOF: Independent works 3hrs/day as Software engineer   PATIENT GOALS improve dizziness   OBJECTIVE:   DIAGNOSTIC FINDINGS: 10/18/21 brain MRI: No acute intracranial abnormality.  Stable since June and largely normal for age noncontrast MRI appearance of the brain. Right anterior convexity scalp hematoma.  COGNITION: Overall cognitive status: Within functional limits for tasks assessed   SENSATION: WFL Reports occasional N/T in fingertips   POSTURE: No Significant postural limitations   Cervical ROM:    Active A/PROM (deg) eval  Flexion 25  Extension 28  Right lateral flexion 20  Left lateral flexion 30  Right rotation 50  Left rotation 54  (Blank rows = not tested)  *no pain reported   GAIT: Gait pattern: WFL  decreased gait speed and slight imbalance  Assistive device utilized: None Level of assistance: Modified independence    PATIENT SURVEYS:  FOTO [phone]   VESTIBULAR ASSESSMENT   GENERAL OBSERVATION: patient is wearing transition progressive lenses     OCULOMOTOR EXAM:   Ocular Alignment: normal   Ocular ROM: No Limitations   Spontaneous Nystagmus: absent   Gaze-Induced Nystagmus: absent   Smooth Pursuits: saccades and 1 in each direction- normal for age   Saccades:  slightly slow and poor trajectory d/t cognition       VESTIBULAR - OCULAR REFLEX:    Slow VOR: Comment: poor gaze stability to R; intact vertical   VOR Cancellation: Corrective Saccades, L>R   Head-Impulse Test: HIT Right: positive HIT Left: negative       POSITIONAL TESTING:  Right Roll Test: negative Left Roll Test: negative  Right Dix-Hallpike: negative, however pt could not tolerate proper positioning d/t neck discomfort Left Sidelying: L upbeating torsional nystagmus; Duration: 10 sec     VESTIBULAR TREATMENT:   PATIENT EDUCATION: Education details: prognosis, POC, edu on BPPV and handout Person educated: Patient Education method: Explanation, Demonstration, Tactile cues, Verbal cues, and Handouts Education comprehension: verbalized understanding   GOALS: Goals reviewed with patient? Yes  SHORT TERM GOALS: Target date: 12/13/2021  Patient to be independent with initial HEP. Baseline: HEP initiated Goal status: INITIAL    LONG TERM GOALS: Target date: 12/27/2021  Patient to be independent with advanced HEP. Baseline: Not yet initiated  Goal status: INITIAL  Patient to report 0/10 dizziness with standing vertical and horizontal VOR for 30 seconds. Baseline: Unable Goal status: INITIAL  Patient will report 0/10 dizziness with bed mobility.  Baseline: Symptomatic  Goal status: INITIAL  Patient to demonstrate mild-moderate sway with M-CTSIB condition with eyes closed/foam surface in  order to improve safety in environments with uneven surfaces and dim lighting. Baseline: NT Goal status: INITIAL  Patient to score at least 20/24 on DGI in order to decrease risk of falls. Baseline: NT Goal status: INITIAL  Patient to score at least 65 on FOTO in order to indicate improved functional outcomes.  Baseline: 64 Goal status: INITIAL    ASSESSMENT:  CLINICAL IMPRESSION:   Patient is a 87 y/o M presenting to OPPT with c/o dizziness for the past couple months. Of note, patient with an episode of dizziness that occurred in bed on 10/17/21, resulting in ligamentous cervical injury and head laceration. Reports that he wore a cervical collar for 15 days and was released without precautions by neurosurgery. Denies infection/illness, vision changes/double vision, hearing loss, tinnitus, otalgia, migraines. Patient today with limited but nonpainful cervical AROM, decreased gait speed and slight imbalance. Oculomotor exam revealed Slightly slowed and inconsistent trajectory of saccades, decreased gaze stabilization to R with VOR, corrective saccades with VOR cancellation, and positive R HIT. Positional testing was positive for L posterior canalithiasis. Patient was educated on BPPV and reported understanding. Would  benefit from skilled PT services 1-2x/week for 4 weeks to address aforementioned impairments in order to optimize level of function.    OBJECTIVE IMPAIRMENTS Abnormal gait, decreased balance, decreased ROM, dizziness, impaired flexibility, and pain.   ACTIVITY LIMITATIONS carrying, lifting, bending, standing, stairs, transfers, bed mobility, bathing, toileting, dressing, and reach over head  PARTICIPATION LIMITATIONS: meal prep, cleaning, laundry, driving, shopping, community activity, occupation, and yard work  PERSONAL FACTORS  Age, Past/current experiences, Time since onset of injury/illness/exacerbation, and 3+ comorbidities: CKD, HLD, HTN, systolic heart failure   are also  affecting patient's functional outcome.   REHAB POTENTIAL: Good  CLINICAL DECISION MAKING: Evolving/moderate complexity  EVALUATION COMPLEXITY: Moderate   PLAN: PT FREQUENCY: 1-2x/week  PT DURATION: 4 weeks  PLANNED INTERVENTIONS: Therapeutic exercises, Therapeutic activity, Neuromuscular re-education, Balance training, Gait training, Patient/Family education, Self Care, Joint mobilization, Stair training, Vestibular training, Canalith repositioning, Aquatic Therapy, Dry Needling, Cryotherapy, Moist heat, Taping, Manual therapy, and Re-evaluation  PLAN FOR NEXT SESSION: L Epley, MCTSIB, DGI,  initiate VOR and habituation HEP   Janene Harvey, PT, DPT 11/29/21 3:50 PM  [facility] [address] [place], [place] [address] Phone # [phone] Fax # [phone]

## 2021-12-02 NOTE — Therapy (Signed)
OUTPATIENT PHYSICAL THERAPY VESTIBULAR TREATMENT     Patient Name: Justin Shaw MRN: [id number] DOB:[birth year], 87 y.o., male Today's Date: 12/02/2021  PCP: Crist Infante, MD REFERRING PROVIDER: Crist Infante, MD     Past Medical History:  Diagnosis Date   Adenomatous colon polyp    Anal fissure    Chest pain    Chronic kidney disease    Diverticulosis    Dyslipidemia    Hypertension    Past Surgical History:  Procedure Laterality Date   ANAL FISSURE REPAIR     COLONOSCOPY  02/23/2009   INGUINAL HERNIA REPAIR     TONSILLECTOMY     TRANSTHORACIC ECHOCARDIOGRAM  2007   NORMAL LV FUNCTION, AND MILD TRACE MITRAL AND TRICUSPID REGURGITATION   Patient Active Problem List   Diagnosis Date Noted   AKI (acute kidney injury) ([place])    Nonrheumatic aortic valve stenosis    Syncope 10/18/2021   Acute sprain of ligament of neck 10/18/2021   Renal dysfunction 10/18/2021   Essential hypertension 10/18/2021   Dysphagia 10/18/2021   DNR (do not resuscitate) 10/18/2021   Special screening for malignant neoplasms, colon 01/25/2011   Benign neoplasm of colon 01/25/2011   Diverticulosis of colon (without mention of hemorrhage) 01/25/2011   Chest pain 09/16/2010   Hyperlipidemia 09/16/2010    ONSET DATE: 10/17/21  REFERRING DIAG: R42 (ICD-10-CM) - Dizziness and giddiness  THERAPY DIAG:  No diagnosis found.  Rationale for Evaluation and Treatment Rehabilitation  SUBJECTIVE:   SUBJECTIVE STATEMENT: Patient reports 3 episodes of dizziness within the past couple of months. All episodes occurred either in bed or on a medical table while getting carotid testing. Reports that he injured his neck but did not need surgery. Has been sleeping in a recliner because he notices that when she lays flat is when he gets dizzy. Reports that he was in a cervical collar for 15 days; was then cleared by the surgeon for normal movement and activity. Reports that he had a gash on his forehead from  the fall. Had HHPT after his recent hospitalization. Denies infection/illness, vision changes/double vision, hearing loss, tinnitus, otalgia, migraines. Reports "a lot of wax in my ears and have to get them cleaned every 3 weeks."   Pt accompanied by: self  PERTINENT HISTORY: CKD, HLD, HTN, systolic heart failure    PAIN:  Are you having pain? No  PRECAUTIONS: Fall  PATIENT GOALS improve dizziness   OBJECTIVE:     TODAY'S TREATMENT: 12/05/21 Activity Comments                        Below measures were taken at time of initial evaluation unless otherwise specified:   DIAGNOSTIC FINDINGS: 10/18/21 brain MRI: No acute intracranial abnormality. Stable since June and largely normal for age noncontrast MRI appearance of the brain. Right anterior convexity scalp hematoma.  COGNITION: Overall cognitive status: Within functional limits for tasks assessed   SENSATION: WFL Reports occasional N/T in fingertips   POSTURE: No Significant postural limitations   Cervical ROM:    Active A/PROM (deg) eval  Flexion 25  Extension 28  Right lateral flexion 20  Left lateral flexion 30  Right rotation 50  Left rotation 54  (Blank rows = not tested)  *no pain reported   GAIT: Gait pattern: WFL decreased gait speed and slight imbalance  Assistive device utilized: None Level of assistance: Modified independence    PATIENT SURVEYS:  FOTO [phone]   VESTIBULAR ASSESSMENT  GENERAL OBSERVATION: patient is wearing transition progressive lenses     OCULOMOTOR EXAM:   Ocular Alignment: normal   Ocular ROM: No Limitations   Spontaneous Nystagmus: absent   Gaze-Induced Nystagmus: absent   Smooth Pursuits: saccades and 1 in each direction- normal for age   Saccades:  slightly slow and poor trajectory d/t cognition       VESTIBULAR - OCULAR REFLEX:    Slow VOR: Comment: poor gaze stability to R; intact vertical   VOR Cancellation: Corrective Saccades,  L>R   Head-Impulse Test: HIT Right: positive HIT Left: negative       POSITIONAL TESTING:  Right Roll Test: negative Left Roll Test: negative  Right Dix-Hallpike: negative, however pt could not tolerate proper positioning d/t neck discomfort Left Sidelying: L upbeating torsional nystagmus; Duration: 10 sec     VESTIBULAR TREATMENT:   PATIENT EDUCATION: Education details: prognosis, POC, edu on BPPV and handout Person educated: Patient Education method: Explanation, Demonstration, Tactile cues, Verbal cues, and Handouts Education comprehension: verbalized understanding   GOALS: Goals reviewed with patient? Yes  SHORT TERM GOALS: Target date: 12/13/2021  Patient to be independent with initial HEP. Baseline: HEP initiated Goal status: IN PROGRESS    LONG TERM GOALS: Target date: 12/27/2021  Patient to be independent with advanced HEP. Baseline: Not yet initiated  Goal status: IN PROGRESS  Patient to report 0/10 dizziness with standing vertical and horizontal VOR for 30 seconds. Baseline: Unable Goal status: IN PROGRESS  Patient will report 0/10 dizziness with bed mobility.  Baseline: Symptomatic  Goal status: IN PROGRESS  Patient to demonstrate mild-moderate sway with M-CTSIB condition with eyes closed/foam surface in order to improve safety in environments with uneven surfaces and dim lighting. Baseline: NT Goal status: IN PROGRESS  Patient to score at least 20/24 on DGI in order to decrease risk of falls. Baseline: NT Goal status: IN PROGRESS  Patient to score at least 65 on FOTO in order to indicate improved functional outcomes.  Baseline: 64 Goal status: IN PROGRESS    ASSESSMENT:  CLINICAL IMPRESSION:   Patient is a 87 y/o M presenting to OPPT with c/o dizziness for the past couple months. Of note, patient with an episode of dizziness that occurred in bed on 10/17/21, resulting in ligamentous cervical injury and head laceration. Reports that he wore  a cervical collar for 15 days and was released without precautions by neurosurgery. Denies infection/illness, vision changes/double vision, hearing loss, tinnitus, otalgia, migraines. Patient today with limited but nonpainful cervical AROM, decreased gait speed and slight imbalance. Oculomotor exam revealed Slightly slowed and inconsistent trajectory of saccades, decreased gaze stabilization to R with VOR, corrective saccades with VOR cancellation, and positive R HIT. Positional testing was positive for L posterior canalithiasis. Patient was educated on BPPV and reported understanding. Would benefit from skilled PT services 1-2x/week for 4 weeks to address aforementioned impairments in order to optimize level of function.    OBJECTIVE IMPAIRMENTS Abnormal gait, decreased balance, decreased ROM, dizziness, impaired flexibility, and pain.   ACTIVITY LIMITATIONS carrying, lifting, bending, standing, stairs, transfers, bed mobility, bathing, toileting, dressing, and reach over head  PARTICIPATION LIMITATIONS: meal prep, cleaning, laundry, driving, shopping, community activity, occupation, and yard work  PERSONAL FACTORS  Age, Past/current experiences, Time since onset of injury/illness/exacerbation, and 3+ comorbidities: CKD, HLD, HTN, systolic heart failure   are also affecting patient's functional outcome.   REHAB POTENTIAL: Good  CLINICAL DECISION MAKING: Evolving/moderate complexity  EVALUATION COMPLEXITY: Moderate   PLAN: PT FREQUENCY: 1-2x/week  PT DURATION: 4 weeks  PLANNED INTERVENTIONS: Therapeutic exercises, Therapeutic activity, Neuromuscular re-education, Balance training, Gait training, Patient/Family education, Self Care, Joint mobilization, Stair training, Vestibular training, Canalith repositioning, Aquatic Therapy, Dry Needling, Cryotherapy, Moist heat, Taping, Manual therapy, and Re-evaluation  PLAN FOR NEXT SESSION: L Epley, MCTSIB, DGI,  initiate VOR and habituation  HEP   Janene Harvey, PT, DPT 12/02/21 12:04 PM  [facility] [address] [place], [place] [address] Phone # [phone] Fax # [phone]

## 2021-12-05 ENCOUNTER — Encounter (HOSPITAL_BASED_OUTPATIENT_CLINIC_OR_DEPARTMENT_OTHER): Payer: Self-pay | Admitting: Cardiology

## 2021-12-05 ENCOUNTER — Telehealth: Payer: Self-pay | Admitting: Internal Medicine

## 2021-12-05 ENCOUNTER — Ambulatory Visit: Payer: Medicare Other | Admitting: Physical Therapy

## 2021-12-05 ENCOUNTER — Encounter: Payer: Self-pay | Admitting: Physical Therapy

## 2021-12-05 DIAGNOSIS — R2681 Unsteadiness on feet: Secondary | ICD-10-CM

## 2021-12-05 DIAGNOSIS — H8112 Benign paroxysmal vertigo, left ear: Secondary | ICD-10-CM | POA: Diagnosis not present

## 2021-12-05 NOTE — Telephone Encounter (Addendum)
Patient is calling wanting to know his heart monitor results due to not being able to drive until he hears back.

## 2021-12-05 NOTE — Telephone Encounter (Signed)
Returned call to patient who states that he is waiting to hear back about heart monitor results. Patient states he is also waiting to hear if he can return to driving. Advised patient I would forward message over Dr. Margaretann Loveless as well as Dr. Radford Pax to advise on this.

## 2021-12-08 ENCOUNTER — Ambulatory Visit: Payer: Medicare Other

## 2021-12-08 DIAGNOSIS — R2681 Unsteadiness on feet: Secondary | ICD-10-CM

## 2021-12-08 DIAGNOSIS — H8112 Benign paroxysmal vertigo, left ear: Secondary | ICD-10-CM

## 2021-12-08 NOTE — Therapy (Signed)
OUTPATIENT PHYSICAL THERAPY VESTIBULAR TREATMENT     Patient Name: Justin Shaw MRN: [id number] DOB:1934-11-12, 87 y.o., male Today's Date: 12/08/2021  PCP: Crist Infante, MD REFERRING PROVIDER: Crist Infante, MD   PT End of Session - 12/08/21 1351     Visit Number 3    Number of Visits 9    Date for PT Re-Evaluation 12/27/21    Authorization Type [employer] Medicare    PT Start Time 1400    PT Stop Time 9470    PT Time Calculation (min) 45 min    Equipment Utilized During Treatment Gait belt    Activity Tolerance Patient tolerated treatment well    Behavior During Therapy WFL for tasks assessed/performed              Past Medical History:  Diagnosis Date   Adenomatous colon polyp    Anal fissure    Chest pain    Chronic kidney disease    Diverticulosis    Dyslipidemia    Hypertension    Past Surgical History:  Procedure Laterality Date   ANAL FISSURE REPAIR     COLONOSCOPY  02/23/2009   INGUINAL HERNIA REPAIR     TONSILLECTOMY     TRANSTHORACIC ECHOCARDIOGRAM  2007   NORMAL LV FUNCTION, AND MILD TRACE MITRAL AND TRICUSPID REGURGITATION   Patient Active Problem List   Diagnosis Date Noted   AKI (acute kidney injury) ([place])    Nonrheumatic aortic valve stenosis    Syncope 10/18/2021   Acute sprain of ligament of neck 10/18/2021   Renal dysfunction 10/18/2021   Essential hypertension 10/18/2021   Dysphagia 10/18/2021   DNR (do not resuscitate) 10/18/2021   Special screening for malignant neoplasms, colon 01/25/2011   Benign neoplasm of colon 01/25/2011   Diverticulosis of colon (without mention of hemorrhage) 01/25/2011   Chest pain 09/16/2010   Hyperlipidemia 09/16/2010    ONSET DATE: 10/17/21  REFERRING DIAG: R42 (ICD-10-CM) - Dizziness and giddiness  THERAPY DIAG:  BPPV (benign paroxysmal positional vertigo), left  Unsteadiness on feet  Rationale for Evaluation and Treatment Rehabilitation  SUBJECTIVE:   SUBJECTIVE STATEMENT: No dizziness  when laying down per report and reports he has been completing balance exercises.    Pt accompanied by: self  PERTINENT HISTORY: CKD, HLD, HTN, systolic heart failure    PAIN:  Are you having pain? No  PRECAUTIONS: Fall  PATIENT GOALS improve dizziness   OBJECTIVE:     TODAY'S TREATMENT: 12/08/21 Activity Comments  L DH L upbeating torsional nystagmus lasting ~18 sec  L Epley  Limited neck ROM but tolerated well  L DH L upbeating torsional nystagmus lasting ~8 sec  L Epley  Tolerated well; some dizziness upon sitting up   L DH L upbeating torsional nystagmus lasting ~11 sec  L Epley Tolerated well, notes less dizziness with arising  VOR x 1  -horizontal: 4x30 sec--multiple errors, frequent cues for "keep your eye on the target" -vertical: 4x30 sec difficulty with maintaining focus with head/neck extension     M-CTSIB  Condition 1: Firm Surface, EO 30 Sec, Normal Sway  Condition 2: Firm Surface, EC 30 Sec, Mild Sway  Condition 3: Foam Surface, EO 30 Sec, Mild Sway  Condition 4: Foam Surface, EC 10 Sec, Severe Sway         HOME EXERCISE PROGRAM Last updated: 12/05/21 Access Code: [email or web address] URL: [email or web address] Date: 12/05/2021 Prepared by: [facility]  Exercises - Romberg Stance with Eyes Closed  -  1 x daily - 5 x weekly - 2 sets - 30 sec hold - Brandt-Daroff Vestibular Exercise  - 1 x daily - 5 x weekly - 2 sets - 3-5 reps Access Code: [email or web address] - Seated Gaze Stabilization with Head Rotation  - 1 x daily - 7 x weekly - 3-5 sets - 30 sec hold - Seated Gaze Stabilization with Head Nod  - 1 x daily - 7 x weekly - 3-5 sets - 30 sec hold    PATIENT EDUCATION: Education details: initiated HEP, exam findings, edu on post-CRM instructions Person educated: Patient Education method: Explanation, Demonstration, Tactile cues, Verbal cues, and Handouts Education comprehension: verbalized understanding and returned  demonstration   Below measures were taken at time of initial evaluation unless otherwise specified:   DIAGNOSTIC FINDINGS: 10/18/21 brain MRI: No acute intracranial abnormality. Stable since June and largely normal for age noncontrast MRI appearance of the brain. Right anterior convexity scalp hematoma.  COGNITION: Overall cognitive status: Within functional limits for tasks assessed   SENSATION: WFL Reports occasional N/T in fingertips   POSTURE: No Significant postural limitations   Cervical ROM:    Active A/PROM (deg) eval  Flexion 25  Extension 28  Right lateral flexion 20  Left lateral flexion 30  Right rotation 50  Left rotation 54  (Blank rows = not tested)  *no pain reported   GAIT: Gait pattern: WFL decreased gait speed and slight imbalance  Assistive device utilized: None Level of assistance: Modified independence    PATIENT SURVEYS:  FOTO [phone]   VESTIBULAR ASSESSMENT   GENERAL OBSERVATION: patient is wearing transition progressive lenses     OCULOMOTOR EXAM:   Ocular Alignment: normal   Ocular ROM: No Limitations   Spontaneous Nystagmus: absent   Gaze-Induced Nystagmus: absent   Smooth Pursuits: saccades and 1 in each direction- normal for age   Saccades:  slightly slow and poor trajectory d/t cognition       VESTIBULAR - OCULAR REFLEX:    Slow VOR: Comment: poor gaze stability to R; intact vertical   VOR Cancellation: Corrective Saccades, L>R   Head-Impulse Test: HIT Right: positive HIT Left: negative       POSITIONAL TESTING:  Right Roll Test: negative Left Roll Test: negative  Right Dix-Hallpike: negative, however pt could not tolerate proper positioning d/t neck discomfort Left Sidelying: L upbeating torsional nystagmus; Duration: 10 sec     VESTIBULAR TREATMENT:   PATIENT EDUCATION: Education details: prognosis, POC, edu on BPPV and handout Person educated: Patient Education method: Explanation, Demonstration, Tactile  cues, Verbal cues, and Handouts Education comprehension: verbalized understanding   GOALS: Goals reviewed with patient? Yes  SHORT TERM GOALS: Target date: 12/13/2021  Patient to be independent with initial HEP. Baseline: HEP initiated Goal status: IN PROGRESS    LONG TERM GOALS: Target date: 12/27/2021  Patient to be independent with advanced HEP. Baseline: Not yet initiated  Goal status: IN PROGRESS  Patient to report 0/10 dizziness with standing vertical and horizontal VOR for 30 seconds. Baseline: Unable Goal status: IN PROGRESS  Patient will report 0/10 dizziness with bed mobility.  Baseline: Symptomatic  Goal status: IN PROGRESS  Patient to demonstrate mild-moderate sway with M-CTSIB condition with eyes closed/foam surface in order to improve safety in environments with uneven surfaces and dim lighting. Baseline: NT Goal status: IN PROGRESS  Patient to score at least 20/24 on DGI in order to decrease risk of falls. Baseline: NT Goal status: IN PROGRESS  Patient to score at least  37 on FOTO in order to indicate improved functional outcomes.  Baseline: 64 Goal status: IN PROGRESS    ASSESSMENT:  CLINICAL IMPRESSION: Continues to exhibit +Dix-Hallpike left ear with brief latency before onset of nystagmus of 8-15 sec duration w/ report of vertigo/dizziness and able to tolerate 3 canalith repositioning maneuvers.  Proceeded with VOR x 1 training exhibiting quite a bit of difficulty with maintaining gaze/focus on target and requiring frequent verbal instruction in sequence and requiring very slow, deliberate head movements in order to reduce errors/slips.  Continued sessions indicated to progress vestibular rehabilitation  OBJECTIVE IMPAIRMENTS Abnormal gait, decreased balance, decreased ROM, dizziness, impaired flexibility, and pain.   ACTIVITY LIMITATIONS carrying, lifting, bending, standing, stairs, transfers, bed mobility, bathing, toileting, dressing, and reach  over head  PARTICIPATION LIMITATIONS: meal prep, cleaning, laundry, driving, shopping, community activity, occupation, and yard work  PERSONAL FACTORS  Age, Past/current experiences, Time since onset of injury/illness/exacerbation, and 3+ comorbidities: CKD, HLD, HTN, systolic heart failure   are also affecting patient's functional outcome.   REHAB POTENTIAL: Good  CLINICAL DECISION MAKING: Evolving/moderate complexity  EVALUATION COMPLEXITY: Moderate   PLAN: PT FREQUENCY: 1-2x/week  PT DURATION: 4 weeks  PLANNED INTERVENTIONS: Therapeutic exercises, Therapeutic activity, Neuromuscular re-education, Balance training, Gait training, Patient/Family education, Self Care, Joint mobilization, Stair training, Vestibular training, Canalith repositioning, Aquatic Therapy, Dry Needling, Cryotherapy, Moist heat, Taping, Manual therapy, and Re-evaluation  PLAN FOR NEXT SESSION: retest L DH,  re-assess VOR and habituation HEP   3:35 PM, 12/08/21 M. Sherlyn Lees, PT, DPT Physical Therapist- [place] Office Number: [phone]   [place] [facility] [address], [place], [place] [address] Phone # [phone] Fax # [phone]

## 2021-12-13 ENCOUNTER — Ambulatory Visit: Payer: Medicare Other

## 2021-12-13 DIAGNOSIS — H8112 Benign paroxysmal vertigo, left ear: Secondary | ICD-10-CM | POA: Diagnosis not present

## 2021-12-13 DIAGNOSIS — R2681 Unsteadiness on feet: Secondary | ICD-10-CM

## 2021-12-13 NOTE — Therapy (Signed)
OUTPATIENT PHYSICAL THERAPY VESTIBULAR TREATMENT     Patient Name: Justin Shaw MRN: [id number] DOB:10-28-1934, 87 y.o., male Today's Date: 12/13/2021  PCP: Crist Infante, MD REFERRING PROVIDER: Crist Infante, MD   PT End of Session - 12/13/21 1616     Visit Number 4    Number of Visits 9    Date for PT Re-Evaluation 12/27/21    Authorization Type [facility] Medicare    Equipment Utilized During Treatment Gait belt    Activity Tolerance Patient tolerated treatment well    Behavior During Therapy [facility] for tasks assessed/performed              Past Medical History:  Diagnosis Date   Adenomatous colon polyp    Anal fissure    Chest pain    Chronic kidney disease    Diverticulosis    Dyslipidemia    Hypertension    Past Surgical History:  Procedure Laterality Date   ANAL FISSURE REPAIR     COLONOSCOPY  02/23/2009   INGUINAL HERNIA REPAIR     TONSILLECTOMY     TRANSTHORACIC ECHOCARDIOGRAM  2007   NORMAL LV FUNCTION, AND MILD TRACE MITRAL AND TRICUSPID REGURGITATION   Patient Active Problem List   Diagnosis Date Noted   AKI (acute kidney injury) ([place])    Nonrheumatic aortic valve stenosis    Syncope 10/18/2021   Acute sprain of ligament of neck 10/18/2021   Renal dysfunction 10/18/2021   Essential hypertension 10/18/2021   Dysphagia 10/18/2021   DNR (do not resuscitate) 10/18/2021   Special screening for malignant neoplasms, colon 01/25/2011   Benign neoplasm of colon 01/25/2011   Diverticulosis of colon (without mention of hemorrhage) 01/25/2011   Chest pain 09/16/2010   Hyperlipidemia 09/16/2010    ONSET DATE: 10/17/21  REFERRING DIAG: R42 (ICD-10-CM) - Dizziness and giddiness  THERAPY DIAG:  BPPV (benign paroxysmal positional vertigo), left  Unsteadiness on feet  Rationale for Evaluation and Treatment Rehabilitation  SUBJECTIVE:   SUBJECTIVE STATEMENT: Still sleeping in the recliner to avoid the dizziness    Pt accompanied by: self  PERTINENT  HISTORY: CKD, HLD, HTN, systolic heart failure    PAIN:  Are you having pain? No  PRECAUTIONS: Fall  PATIENT GOALS improve dizziness   OBJECTIVE:   TODAY'S TREATMENT: 12/13/21 Activity Comments  Neck AROM 5x all planes  Left Dix-Hallpike No symptoms, no nystagmus  Right Dix-Hallpike No symptoms, no nystagmus  Chin retractions Attempted in sitting, difficulty in sequencing. Various techniques and Medbridge videos/examples used  Seated VOR x 1 3x30 sec, very slow initially, cue for increased speed  Standing VOR x 1 2x30 sec, more difficulty with horizontal > vertical  Standing balance -Romberg EO/EC -Semi-tandem 2x15 EO/EC     TODAY'S TREATMENT: 12/08/21 Activity Comments  L DH L upbeating torsional nystagmus lasting ~18 sec  L E[place]y  Limited neck ROM but tolerated well  L DH L upbeating torsional nystagmus lasting ~8 sec  L Epley  Tolerated well; some dizziness upon sitting up   L DH L upbeating torsional nystagmus lasting ~11 sec  L Epley Tolerated well, notes less dizziness with arising  VOR x 1  -horizontal: 4x30 sec--multiple errors, frequent cues for "keep your eye on the target" -vertical: 4x30 sec difficulty with maintaining focus with head/neck extension     M-CTSIB  Condition 1: Firm Surface, EO 30 Sec, Normal Sway  Condition 2: Firm Surface, EC 30 Sec, Mild Sway  Condition 3: Foam Surface, EO 30 Sec, Mild Sway  Condition  4: Foam Surface, EC 10 Sec, Severe Sway         HOME EXERCISE PROGRAM Last updated: 12/05/21 Access Code: [email or web address] URL: [email or web address] Date: 12/05/2021 Prepared by: [facility]  Exercises - Romberg Stance with Eyes Closed  - 1 x daily - 5 x weekly - 2 sets - 30 sec hold - Brandt-Daroff Vestibular Exercise  - 1 x daily - 5 x weekly - 2 sets - 3-5 reps Access Code: [email or web address] - Seated Gaze Stabilization with Head Rotation  - 1 x daily - 7 x weekly - 3-5 sets - 30 sec hold -  Seated Gaze Stabilization with Head Nod  - 1 x daily - 7 x weekly - 3-5 sets - 30 sec hold - Semi-Tandem Balance at [employer] Eyes Open  - 1 x daily - 7 x weekly - 1-3 sets - 15 sec hold - Semi-Tandem Balance at [employer] Eyes Closed  - 1 x daily - 7 x weekly - 1-3 sets - 15 sec hold   PATIENT EDUCATION: Education details: initiated HEP, exam findings, edu on post-CRM instructions Person educated: Patient Education method: Explanation, Demonstration, Tactile cues, Verbal cues, and Handouts Education comprehension: verbalized understanding and returned demonstration   Below measures were taken at time of initial evaluation unless otherwise specified:   DIAGNOSTIC FINDINGS: 10/18/21 brain MRI: No acute intracranial abnormality. Stable since June and largely normal for age noncontrast MRI appearance of the brain. Right anterior convexity scalp hematoma.  COGNITION: Overall cognitive status: Within functional limits for tasks assessed   SENSATION: WFL Reports occasional N/T in fingertips   POSTURE: No Significant postural limitations   Cervical ROM:    Active A/PROM (deg) eval  Flexion 25  Extension 28  Right lateral flexion 20  Left lateral flexion 30  Right rotation 50  Left rotation 54  (Blank rows = not tested)  *no pain reported   GAIT: Gait pattern: WFL decreased gait speed and slight imbalance  Assistive device utilized: None Level of assistance: Modified independence    PATIENT SURVEYS:  FOTO [phone]   VESTIBULAR ASSESSMENT   GENERAL OBSERVATION: patient is wearing transition progressive lenses     OCULOMOTOR EXAM:   Ocular Alignment: normal   Ocular ROM: No Limitations   Spontaneous Nystagmus: absent   Gaze-Induced Nystagmus: absent   Smooth Pursuits: saccades and 1 in each direction- normal for age   Saccades:  slightly slow and poor trajectory d/t cognition       VESTIBULAR - OCULAR REFLEX:    Slow VOR: Comment: poor gaze stability to R;  intact vertical   VOR Cancellation: Corrective Saccades, L>R   Head-Impulse Test: HIT Right: positive HIT Left: negative       POSITIONAL TESTING:  Right Roll Test: negative Left Roll Test: negative  Right Dix-Hallpike: negative, however pt could not tolerate proper positioning d/t neck discomfort Left Sidelying: L upbeating torsional nystagmus; Duration: 10 sec     VESTIBULAR TREATMENT:   PATIENT EDUCATION: Education details: prognosis, POC, edu on BPPV and handout Person educated: Patient Education method: Explanation, Demonstration, Tactile cues, Verbal cues, and Handouts Education comprehension: verbalized understanding   GOALS: Goals reviewed with patient? Yes  SHORT TERM GOALS: Target date: 12/13/2021  Patient to be independent with initial HEP. Baseline: HEP initiated Goal status: IN PROGRESS    LONG TERM GOALS: Target date: 12/27/2021  Patient to be independent with advanced HEP. Baseline: Not yet initiated  Goal status: IN PROGRESS  Patient to report 0/10 dizziness with standing vertical and horizontal VOR for 30 seconds. Baseline: Unable Goal status: IN PROGRESS  Patient will report 0/10 dizziness with bed mobility.  Baseline: Symptomatic  Goal status: IN PROGRESS  Patient to demonstrate mild-moderate sway with M-CTSIB condition with eyes closed/foam surface in order to improve safety in environments with uneven surfaces and dim lighting. Baseline: NT Goal status: IN PROGRESS  Patient to score at least 20/24 on DGI in order to decrease risk of falls. Baseline: NT Goal status: IN PROGRESS  Patient to score at least 65 on FOTO in order to indicate improved functional outcomes.  Baseline: 64 Goal status: IN PROGRESS    ASSESSMENT:  CLINICAL IMPRESSION: No nystagmus or report of vertigo w/ Dix-Hallpike. Proceeded w/ training in chin retractions for neck strength and postural correction, but very difficult to coordinate. Continued with VOR training  requiring decreased speed but able to increase to approx 30 Hz and maintain gaze stability. Standing balance reveals increased sway and LOB with eyes closed conditions. Continued sessions to re-assess for positional vertigo and progress vestibular rehab  OBJECTIVE IMPAIRMENTS Abnormal gait, decreased balance, decreased ROM, dizziness, impaired flexibility, and pain.   ACTIVITY LIMITATIONS carrying, lifting, bending, standing, stairs, transfers, bed mobility, bathing, toileting, dressing, and reach over head  PARTICIPATION LIMITATIONS: meal prep, cleaning, laundry, driving, shopping, community activity, occupation, and yard work  PERSONAL FACTORS  Age, Past/current experiences, Time since onset of injury/illness/exacerbation, and 3+ comorbidities: CKD, HLD, HTN, systolic heart failure   are also affecting patient's functional outcome.   REHAB POTENTIAL: Good  CLINICAL DECISION MAKING: Evolving/moderate complexity  EVALUATION COMPLEXITY: Moderate   PLAN: PT FREQUENCY: 1-2x/week  PT DURATION: 4 weeks  PLANNED INTERVENTIONS: Therapeutic exercises, Therapeutic activity, Neuromuscular re-education, Balance training, Gait training, Patient/Family education, Self Care, Joint mobilization, Stair training, Vestibular training, Canalith repositioning, Aquatic Therapy, Dry Needling, Cryotherapy, Moist heat, Taping, Manual therapy, and Re-evaluation  PLAN FOR NEXT SESSION: retest L DH,  re-assess VOR and habituation HEP   4:16 PM, 12/13/21 M. Sherlyn Lees, PT, DPT Physical Therapist- [place] Office Number: [phone]   [facility] [address], [place], [place] [address] Phone # [phone] Fax # [phone]

## 2021-12-19 NOTE — Therapy (Signed)
OUTPATIENT PHYSICAL THERAPY VESTIBULAR TREATMENT     Patient Name: Justin Shaw MRN: [id number] DOB:07/19/1934, 87 y.o., male Today's Date: 12/20/2021  PCP: Crist Infante, MD REFERRING PROVIDER: Crist Infante, MD   PT End of Session - 12/20/21 1400     Visit Number 5    Number of Visits 9    Date for PT Re-Evaluation 12/27/21    Authorization Type [employer] Medicare    PT Start Time 1308    PT Stop Time 4818    PT Time Calculation (min) 49 min    Equipment Utilized During Treatment Gait belt    Activity Tolerance Patient tolerated treatment well    Behavior During Therapy WFL for tasks assessed/performed               Past Medical History:  Diagnosis Date   Adenomatous colon polyp    Anal fissure    Chest pain    Chronic kidney disease    Diverticulosis    Dyslipidemia    Hypertension    Past Surgical History:  Procedure Laterality Date   ANAL FISSURE REPAIR     COLONOSCOPY  02/23/2009   INGUINAL HERNIA REPAIR     TONSILLECTOMY     TRANSTHORACIC ECHOCARDIOGRAM  2007   NORMAL LV FUNCTION, AND MILD TRACE MITRAL AND TRICUSPID REGURGITATION   Patient Active Problem List   Diagnosis Date Noted   AKI (acute kidney injury) ([place])    Nonrheumatic aortic valve stenosis    Syncope 10/18/2021   Acute sprain of ligament of neck 10/18/2021   Renal dysfunction 10/18/2021   Essential hypertension 10/18/2021   Dysphagia 10/18/2021   DNR (do not resuscitate) 10/18/2021   Special screening for malignant neoplasms, colon 01/25/2011   Benign neoplasm of colon 01/25/2011   Diverticulosis of colon (without mention of hemorrhage) 01/25/2011   Chest pain 09/16/2010   Hyperlipidemia 09/16/2010    ONSET DATE: 10/17/21  REFERRING DIAG: R42 (ICD-10-CM) - Dizziness and giddiness  THERAPY DIAG:  BPPV (benign paroxysmal positional vertigo), left  Unsteadiness on feet  Rationale for Evaluation and Treatment Rehabilitation  SUBJECTIVE:   SUBJECTIVE STATEMENT: Still  sleeping in the recliner. Ordered an adjustable bed.   Pt accompanied by: self  PERTINENT HISTORY: CKD, HLD, HTN, systolic heart failure    PAIN:  Are you having pain? No  PRECAUTIONS: Fall  PATIENT GOALS improve dizziness   OBJECTIVE:      TODAY'S TREATMENT: 12/20/21 Activity Comments  DGI 18/24  Standing VOR horizontal 30" No dizziness; retinal slip to L  Standing VOR vertical 30" Good gaze stability; no dizziness   L DH negative  R DH Negative   palpation No TTP; increased soft tissue restriction in B cervical paraspinals and suboccipitals  Cervical rotation SNAG each side 10x To tolerance; heavy cueing to maintain proper hand position/motion   Cervical extension SNAG 10x To tolerance   R/L trap stretch 20" each Limited ROM and manual cueing for proper positioning      M-CTSIB  Condition 1: Firm Surface, EO 30 Sec, Normal and Mild Sway  Condition 2: Firm Surface, EC 30 Sec, Mild Sway  Condition 3: Foam Surface, EO 30 Sec, Mild Sway  Condition 4: Foam Surface, EC 30 Sec, Moderate Sway      [facility] PT Assessment - 12/20/21 0001       Dynamic Gait Index   Level Surface Normal    Change in Gait Speed Normal    Gait with Horizontal Head Turns Mild Impairment  Gait with Vertical Head Turns Mild Impairment    Gait and Pivot Turn Normal    Step Over Obstacle Moderate Impairment    Step Around Obstacles Mild Impairment    Steps Mild Impairment    Total Score 18            HOME EXERCISE PROGRAM Last updated: 12/20/21 Access Code: [email or web address] URL: [email or web address] Date: 12/20/2021 Prepared by: [facility]  Exercises - Standing Gaze Stabilization with Head Rotation  - 1 x daily - 7 x weekly - 3-5 sets - 30 sec hold - Standing Gaze Stabilization with Head Nod  - 1 x daily - 7 x weekly - 3-5 sets - 30 sec hold - Semi-Tandem Balance at [employer] Eyes Open  - 1 x daily - 7 x weekly - 1-3 sets - 15 sec  hold - Semi-Tandem Balance at [employer] Eyes Closed  - 1 x daily - 7 x weekly - 1-3 sets - 15 sec hold - Side Step Overs with Cones and Unilateral Counter Support  - 1 x daily - 5 x weekly - 2 sets - 10 reps - Mid-Lower Cervical Extension SNAG with Strap  - 1 x daily - 5 x weekly - 2 sets - 10 reps - Seated Assisted Cervical Rotation with Towel  - 1 x daily - 5 x weekly - 2 sets - 10 reps - Seated Upper Trapezius Stretch  - 1 x daily - 5 x weekly - 2 sets - 20 sec hold  PATIENT EDUCATION: Education details: educated on returning to laying flat with wife present for safety; HEP review and update Person educated: Patient Education method: Consulting civil engineer, Demonstration, Tactile cues, Verbal cues, and Handouts Education comprehension: verbalized understanding and returned demonstration    Below measures were taken at time of initial evaluation unless otherwise specified:   DIAGNOSTIC FINDINGS: 10/18/21 brain MRI: No acute intracranial abnormality. Stable since June and largely normal for age noncontrast MRI appearance of the brain. Right anterior convexity scalp hematoma.  COGNITION: Overall cognitive status: Within functional limits for tasks assessed   SENSATION: WFL Reports occasional N/T in fingertips   POSTURE: No Significant postural limitations   Cervical ROM:    Active A/PROM (deg) eval  Flexion 25  Extension 28  Right lateral flexion 20  Left lateral flexion 30  Right rotation 50  Left rotation 54  (Blank rows = not tested)  *no pain reported   GAIT: Gait pattern: WFL decreased gait speed and slight imbalance  Assistive device utilized: None Level of assistance: Modified independence    PATIENT SURVEYS:  FOTO [phone]   VESTIBULAR ASSESSMENT   GENERAL OBSERVATION: patient is wearing transition progressive lenses     OCULOMOTOR EXAM:   Ocular Alignment: normal   Ocular ROM: No Limitations   Spontaneous Nystagmus: absent   Gaze-Induced Nystagmus:  absent   Smooth Pursuits: saccades and 1 in each direction- normal for age   Saccades:  slightly slow and poor trajectory d/t cognition       VESTIBULAR - OCULAR REFLEX:    Slow VOR: Comment: poor gaze stability to R; intact vertical   VOR Cancellation: Corrective Saccades, L>R   Head-Impulse Test: HIT Right: positive HIT Left: negative       POSITIONAL TESTING:  Right Roll Test: negative Left Roll Test: negative  Right Dix-Hallpike: negative, however pt could not tolerate proper positioning d/t neck discomfort Left Sidelying: L upbeating torsional nystagmus; Duration: 10 sec  VESTIBULAR TREATMENT:   PATIENT EDUCATION: Education details: prognosis, POC, edu on BPPV and handout Person educated: Patient Education method: Explanation, Demonstration, Tactile cues, Verbal cues, and Handouts Education comprehension: verbalized understanding   GOALS: Goals reviewed with patient? Yes  SHORT TERM GOALS: Target date: 12/13/2021  Patient to be independent with initial HEP. Baseline: HEP initiated Goal status: MET 12/20/21    LONG TERM GOALS: Target date: 12/27/2021  Patient to be independent with advanced HEP. Baseline: Not yet initiated  Goal status: IN PROGRESS  Patient to report 0/10 dizziness with standing vertical and horizontal VOR for 30 seconds. Baseline: Unable Goal status: MET 12/20/21  Patient will report 0/10 dizziness with bed mobility.  Baseline: Symptomatic  Goal status: MET 12/20/21   Patient to demonstrate mild-moderate sway with M-CTSIB condition with eyes closed/foam surface in order to improve safety in environments with uneven surfaces and dim lighting. Baseline: severe sway on 12/05/21, mod-severe sway on 12/20/21, Goal status: IN PROGRESS  Patient to score at least 20/24 on DGI in order to decrease risk of falls. Baseline: 18/24 on 12/05/21, 18/24 on 12/20/21 Goal status: IN PROGRESS  Patient to score at least 65 on FOTO in order to  indicate improved functional outcomes.  Baseline: 64 Goal status: IN PROGRESS    ASSESSMENT:  CLINICAL IMPRESSION: Patient arrived to session with report of continuing to sleep in recliner d/t hesitation to feel dizzy at home again. Encouraged patient to return to sleeping without modifications to fully assess improvement in dizziness. Dizziness-related goals have been met. DGI score still 18/24, unchanged from last measurement. Most difficulty evident with stepping over obstacles and around obstacles. Balance on foam revealed slight improvement in ability to maintain stability with EC. Patient notes difficulty performing HEP d/t c/o neck pain and stiffness, thus initiated gentle stretching which was well-tolerated today, and updated this into HEP.    OBJECTIVE IMPAIRMENTS Abnormal gait, decreased balance, decreased ROM, dizziness, impaired flexibility, and pain.   ACTIVITY LIMITATIONS carrying, lifting, bending, standing, stairs, transfers, bed mobility, bathing, toileting, dressing, and reach over head  PARTICIPATION LIMITATIONS: meal prep, cleaning, laundry, driving, shopping, community activity, occupation, and yard work  PERSONAL FACTORS  Age, Past/current experiences, Time since onset of injury/illness/exacerbation, and 3+ comorbidities: CKD, HLD, HTN, systolic heart failure   are also affecting patient's functional outcome.   REHAB POTENTIAL: Good  CLINICAL DECISION MAKING: Evolving/moderate complexity  EVALUATION COMPLEXITY: Moderate   PLAN: PT FREQUENCY: 1-2x/week  PT DURATION: 4 weeks  PLANNED INTERVENTIONS: Therapeutic exercises, Therapeutic activity, Neuromuscular re-education, Balance training, Gait training, Patient/Family education, Self Care, Joint mobilization, Stair training, Vestibular training, Canalith repositioning, Aquatic Therapy, Dry Needling, Cryotherapy, Moist heat, Taping, Manual therapy, and Re-evaluation  PLAN FOR NEXT SESSION:  progress cervical  stretching to tolerance and balance   Janene Harvey, PT, DPT 12/20/21 2:14 PM  [facility] at [facility] [address], [place], [place] [address] Phone # [phone] Fax # [phone]

## 2021-12-20 ENCOUNTER — Encounter: Payer: Self-pay | Admitting: Physical Therapy

## 2021-12-20 ENCOUNTER — Ambulatory Visit: Payer: Medicare Other | Admitting: Physical Therapy

## 2021-12-20 DIAGNOSIS — H8112 Benign paroxysmal vertigo, left ear: Secondary | ICD-10-CM

## 2021-12-20 DIAGNOSIS — R2681 Unsteadiness on feet: Secondary | ICD-10-CM

## 2021-12-25 NOTE — Progress Notes (Unsigned)
Cardiology Office Note:    Date:  12/28/2021   ID:  Justin Shaw, DOB Jan 13, 1935, MRN [id number]  PCP:  Crist Infante, MD  Cardiologist:  Fransico Him, MD  Electrophysiologist:  None   Referring MD: Crist Infante, MD   Chief Complaint: follow-up of syncope  History of Present Illness:    Justin Shaw is a 87 y.o. male with a history of syncope with no significant arrhythmias noted on recent monitor, moderate aortic stenosis, hypertension, hyperlipidemia, and CKD stage III who is followed by Dr. Radford Pax and presents today for follow-up of syncope.  Patient was recently admitted from 10/17/2021 to 10/20/2021 for neck pain after falling out bed. Patient reported that for the 2 days prior to admission he had been having severe episodes of dizziness which he described as vertigo and the room spinning. He was sleeping in a recliner because he could not lie flat as that would worsen his vertigo. On the evening prior to admission, he decided to try to sleep in his bed and went to lay but then apparently fell out of bed. Patient did not remember anything about this event and it was unclear as to whether he actually loss consciousness or not. He hit is head in the process and sustained a laceration requiring suturing. He also hurt his neck. Brain MRI showed a scalp hematoma but no acute intracranial findings. MRI of C-spine showed acute ligamentous injury. Orthostatics were negative. Echo showed LVEF of 45-50% with global hypokinesis and grade 1 diastolic dysfunction as well as mild aortic stenosis but study was poor quality. Therefore, repeat limited Echo was ordered the next day and showed LVEF of 50% with definity contrast and moderate aortic stenosis. DVI of aortic valve was 0.22 which is more consistent with severe aortic stenosis. LV stroke volume index low at 23 and planimetered valve area was 1.2 cm^2. Dr. Radford Pax personally reviewed images and felt this was more consistent with low flow low  gradient moderate aortic stenosis.  Home Benazepril was stopped due to renal function and patient was started on Toprol-XL '25mg'$  daily. Outpatient 30 day monitor was ordered to rule out arrhythmia in light of possible syncopal episode. Monitor showed a 5 beat run of NSVT vs non-sustained atrial tachycardia with aberration but no significant arrhythmias to explain syncopal episode.   He was seen by Finis Bud, NP, on 11/02/2021 for follow-up at which time he was doing well since discharge and denied any dizziness or recurrent syncope. Carotid dopplers were ordered and while these were being done on 11/04/2021 he had witnessed syncopal episode. He reportedly got dizziness and then briefly passed out. He was added onto Dr. Kathalene Frames schedule that same day and EKG showed normal sinus rhythm. He was still wearing the 30 day event monitor. Monitor ultimately showed a 5 beat run of NSVT vs non-sustained atrial tachycardia with aberration but no significant arrhythmias to explain syncopal episode. Syncope was felt to likely be due to an inner ear issues and not a cardiac problem.   Patient presents today for follow-up. Here with wife. Patient is doing much better. He states he has been doing PT who "moved the crystal in his ear" and he has had not recurrent dizziness or syncope since that time. No other cardiac complaints. No chest pain, shortness of breath, orthopnea, PND, palpitations, or lower extremity edema. He brought in a log of his BP/HR today and his BP has been well controlled.   Past Medical History:  Diagnosis Date  Adenomatous colon polyp    Anal fissure    Chest pain    Chronic kidney disease    Diverticulosis    Dyslipidemia    Hypertension     Past Surgical History:  Procedure Laterality Date   ANAL FISSURE REPAIR     COLONOSCOPY  02/23/2009   INGUINAL HERNIA REPAIR     TONSILLECTOMY     TRANSTHORACIC ECHOCARDIOGRAM  2007   NORMAL LV FUNCTION, AND MILD TRACE MITRAL AND TRICUSPID  REGURGITATION    Current Medications: Current Meds  Medication Sig   ALPRAZolam (XANAX) 0.5 MG tablet Take 0.5-1 mg by mouth at bedtime as needed for anxiety or sleep.   b complex vitamins capsule Take 1 capsule by mouth daily.   Cholecalciferol (VITAMIN D3) 50 MCG (2000 UT) capsule Take 1 capsule by mouth daily.    Coenzyme Q10 200 MG capsule Take 200 mg by mouth daily.   fluticasone (FLONASE) 50 MCG/ACT nasal spray Place 1 spray into both nostrils daily.   LIVALO 2 MG TABS Take 1 tablet by mouth daily.    metoprolol succinate (TOPROL XL) 25 MG 24 hr tablet Take 25 mg 1&1/2 tablets daily   mupirocin ointment (BACTROBAN) 2 % Apply topically as needed.     Allergies:   Crestor [rosuvastatin calcium], Hydrocodone, Hydrocodone-acetaminophen, and Oxycodone-acetaminophen   Social History   Socioeconomic History   Marital status: Married    Spouse name: Not on file   Number of children: 3   Years of education: Not on file   Highest education level: Not on file  Occupational History   Occupation: pharmacist  Tobacco Use   Smoking status: Never    Passive exposure: Never   Smokeless tobacco: Never  Substance and Sexual Activity   Alcohol use: Yes    Comment: occasional wine   Drug use: No   Sexual activity: Not on file  Other Topics Concern   Not on file  Social History Narrative   Not on file   Social Determinants of Health   Financial Resource Strain: Not on file  Food Insecurity: Not on file  Transportation Needs: Not on file  Physical Activity: Not on file  Stress: Not on file  Social Connections: Not on file     Family History: The patient's family history includes Diabetes in his mother; Hypertension in his mother; Stroke in his mother. There is no history of Colon cancer.  ROS:   Please see the history of present illness.     EKGs/Labs/Other Studies Reviewed:    The following studies were reviewed:  Complete Echocardiogram 10/18/2021: Impressions  1.  Windows are challenging to fully assess wall motion. Left ventricular  ejection fraction, by estimation, is 45 to 50%. The left ventricle has  mildly decreased function. The left ventricle demonstrates global  hypokinesis. Left ventricular diastolic  parameters are consistent with Grade I diastolic dysfunction (impaired  relaxation).   2. Right ventricular systolic function is normal. The right ventricular  size is normal. There is normal pulmonary artery systolic pressure.   3. The mitral valve is grossly normal. No evidence of mitral valve  regurgitation.   4. Planimetry AVA 1.2 cm2. There is moderate calcification of the aortic  valve. Aortic valve regurgitation is not visualized. Mild aortic valve  stenosis.   5. There is borderline dilatation of the ascending aorta, measuring 35  mm.   6. The inferior vena cava is normal in size with greater than 50%  respiratory variability, suggesting right atrial  pressure of 3 mmHg.   Comparison(s): No prior Echocardiogram. _______________  Limited Echocardiogram 10/19/2021: Impressions:  1. Left ventricular ejection fraction, by estimation, is 50%. Left  ventricular ejection fraction by 2D MOD biplane is 50.4 %. The left  ventricle has low normal function. _______________  Carotid Dopplers 11/04/2021: Summary:  - Right Carotid: There is no evidence of stenosis in the right ICA. The extracranial vessels were near-normal with only minimal wall thickening or plaque.  - Left Carotid: Velocities in the left ICA are consistent with a 1-39%  stenosis.  - Vertebrals:  Bilateral vertebral arteries demonstrate antegrade flow.  - Subclavians: Normal flow hemodynamics were seen in bilateral subclavian arteries.  _______________  Event Monitor 10/27/2021 to 11/25/2021:   Predominat rhythm was normal sinus rhythm with average heart rate 73bpm and ranged from 49-138bpm   Wide complex tachycardia consistent with Nonsustained ventricular tachycardiavs.  nonsustained atrial tachycardia with aberration up to 5 beats.   EKG:  EKG not ordered today.   Recent Labs: 10/19/2021: Hemoglobin 11.6; Platelets 196; TSH 0.682 11/02/2021: Magnesium 2.2 11/11/2021: BUN 32; Creatinine, Ser 1.61; Potassium 4.8; Sodium 139  Recent Lipid Panel No results found for: "CHOL", "TRIG", "HDL", "CHOLHDL", "VLDL", "LDLCALC", "LDLDIRECT"  Physical Exam:    Vital Signs: BP 136/76 (BP Location: Left Arm, Patient Position: Sitting, Cuff Size: Normal)   Pulse 70   Ht '5\' 10"'$  (1.778 m)   Wt 159 lb (72.1 kg)   SpO2 97%   BMI 22.81 kg/m     Wt Readings from Last 3 Encounters:  12/28/21 159 lb (72.1 kg)  11/04/21 154 lb (69.9 kg)  11/02/21 155 lb (70.3 kg)     General: 87 y.o. Caucasian male in no acute distress. HEENT: Normocephalic and atraumatic. Sclera clear.  Neck: Supple. No carotid bruits. No JVD. Heart: RRR. II-III/VI systolic murmur.  No gallops or rubs. Radial pulses 2+ and equal bilaterally. Lungs: No increased work of breathing. Clear to ausculation bilaterally. No wheezes, rhonchi, or rales.  Abdomen: Soft, non-distended, and non-tender to palpation.  Extremities: No lower extremity edema.    Skin: Warm and dry. Neuro: Alert and oriented x3. No focal deficits. Psych: Normal affect. Responds appropriately.   Assessment:    1. History of syncope   2. Aortic valve stenosis, etiology of cardiac valve disease unspecified   3. Primary hypertension   4. Hyperlipidemia, unspecified hyperlipidemia type   5. Stage 3 chronic kidney disease, unspecified whether stage 3a or 3b CKD ([place])     Plan:    Syncope Patient was admitted in 09/2021 after falling out of bed and there was concern that he may of had a syncopal episode although not clear. Orthostatics during admission were negative. Initial Echo showed LVEF of 45-50% with global hypokinesis and grade 1 diastolic dysfunction as well as mild aortic stenosis but study was poor quality. Therefore, repeat  limited Echo was ordered the next day and showed LVEF of 50% with definity contrast and moderate aortic stenosis. Patient then had another very brief syncopal episode in our office on 11/04/2021 while having carotid dopplers done. Vitals and EKG at that time were unremarkable. Carotid dopplers showed only mild disease. Event Monitor (which he was wearing during the event on 8/11) showed a 5 beat run of NSVT (vs non-sustained atrial tachycardia with aberrancy) but no significant arrhythmias to explain syncopal episodes. It was felt that syncopal episodes were likely due to an inner ear issue rather than cardiac in nature.  - Patient states he has  been working with PT who moved the "crystal" in his ear. No recurrent dizziness or syncope since that time.  - Sounds like symptoms were all due to vertigo. No additional cardiac work-up necessary at this time.  Moderate Aortic Stenosis Echo during admission in 09/2021 showed moderate aortic stenosis. DVI of aortic valve was 0.22 which is more consistent with severe aortic stenosis. LV stroke volume index low at 23 and planimetered valve area was 1.2 cm^2. Dr. Radford Pax personally reviewed images and felt this was more consistent with low flow low gradient moderate aortic stenosis. - Will need routine monitoring. Consider repeat Echo in 09/2022.  Hypertension BP well controlled. - Continue Toprol-XL 37.'5mg'$  daily. - Previously on Benazepril but this was stopped during admission in 09/2021 due to renal function. He has also had some hyperkalemia so would be cautious using ACEi/ARBs of MRAs in the future.   Hyperlipidemia LDL 109 in 11/2020. - Continue Livalo '2mg'$  daily. - Patient states labs were recently checked by PCP and they looked good. Will defer management to PCP.   CKD Stage III Baseline creatinine around 1.5 to 1.7.  - Creatinine stable at 1.61 in 10/2021.  Disposition: Follow up in 6 months with Dr. Radford Pax.    Medication Adjustments/Labs and Tests  Ordered: Current medicines are reviewed at length with the patient today.  Concerns regarding medicines are outlined above.  No orders of the defined types were placed in this encounter.  No orders of the defined types were placed in this encounter.   Patient Instructions  Medication Instructions:  Your physician recommends that you continue on your current medications as directed. Please refer to the Current Medication list given to you today.  *If you need a refill on your cardiac medications before your next appointment, please call your pharmacy*   Follow-Up: At [facility], you and your health needs are our priority.  As part of our continuing mission to provide you with exceptional heart care, we have created designated Provider Care Teams.  These Care Teams include your primary Cardiologist (physician) and Advanced Practice Providers (APPs -  Physician Assistants and Nurse Practitioners) who all work together to provide you with the care you need, when you need it.  Your next appointment:   6 month(s)  The format for your next appointment:   In Person  Provider:   Fransico Him, MD      Signed, Darreld Mclean, PA-C  12/28/2021 1:59 PM    [place]

## 2021-12-26 NOTE — Therapy (Signed)
OUTPATIENT PHYSICAL THERAPY VESTIBULAR DISCHARGE SUMMARY     Patient Name: Justin Shaw MRN: [id number] DOB:Apr 29, 1934, 87 y.o., male Today's Date: 12/27/2021  PCP: Crist Infante, MD REFERRING PROVIDER: Crist Infante, MD   Progress Note Reporting Period 11/29/21 to 12/27/21  See note below for Objective Data and Assessment of Progress/Goals.       PT End of Session - 12/27/21 1351     Visit Number 6    Number of Visits 9    Date for PT Re-Evaluation 12/27/21    Authorization Type [employer] Medicare    PT Start Time 6144    PT Stop Time 1350    PT Time Calculation (min) 36 min    Equipment Utilized During Treatment Gait belt    Activity Tolerance Patient tolerated treatment well    Behavior During Therapy WFL for tasks assessed/performed                Past Medical History:  Diagnosis Date   Adenomatous colon polyp    Anal fissure    Chest pain    Chronic kidney disease    Diverticulosis    Dyslipidemia    Hypertension    Past Surgical History:  Procedure Laterality Date   ANAL FISSURE REPAIR     COLONOSCOPY  02/23/2009   INGUINAL HERNIA REPAIR     TONSILLECTOMY     TRANSTHORACIC ECHOCARDIOGRAM  2007   NORMAL LV FUNCTION, AND MILD TRACE MITRAL AND TRICUSPID REGURGITATION   Patient Active Problem List   Diagnosis Date Noted   AKI (acute kidney injury) ([place])    Nonrheumatic aortic valve stenosis    Syncope 10/18/2021   Acute sprain of ligament of neck 10/18/2021   Renal dysfunction 10/18/2021   Essential hypertension 10/18/2021   Dysphagia 10/18/2021   DNR (do not resuscitate) 10/18/2021   Special screening for malignant neoplasms, colon 01/25/2011   Benign neoplasm of colon 01/25/2011   Diverticulosis of colon (without mention of hemorrhage) 01/25/2011   Chest pain 09/16/2010   Hyperlipidemia 09/16/2010    ONSET DATE: 10/17/21  REFERRING DIAG: R42 (ICD-10-CM) - Dizziness and giddiness  THERAPY DIAG:  BPPV (benign paroxysmal positional  vertigo), left  Unsteadiness on feet  Rationale for Evaluation and Treatment Rehabilitation  SUBJECTIVE:   SUBJECTIVE STATEMENT: My neck bothers me a little bit. Took a Tylenol before I came here. Was able to sleep in his bed without dizziness.   Pt accompanied by: self  PERTINENT HISTORY: CKD, HLD, HTN, systolic heart failure    PAIN:  Are you having pain? Yes: NPRS scale: 2-3/10 Pain location: posterior neck Pain description: aching Aggravating factors: at rest Relieving factors: Tyenol  PRECAUTIONS: Fall  PATIENT GOALS improve dizziness   OBJECTIVE:     TODAY'S TREATMENT: 12/27/21 Activity Comments  FOTO [phone]  DGI 20/24   Romberg + head turns EO and EC Mild-mod sway and supervision  Romberg + head nods EO and EC Mild-mod sway and supervision           M-CTSIB  Condition 1: Firm Surface, EO 30 Sec, Normal and Mild Sway  Condition 2: Firm Surface, EC 30 Sec, Mild and Moderate Sway  Condition 3: Foam Surface, EO 30 Sec, Mild and Moderate Sway  Condition 4: Foam Surface, EC 30 Sec, Moderate Sway    [facility] PT Assessment - 12/27/21 0001       Dynamic Gait Index   Level Surface Normal    Change in Gait Speed Normal    Gait with  Horizontal Head Turns Mild Impairment    Gait with Vertical Head Turns Mild Impairment    Gait and Pivot Turn Normal    Step Over Obstacle Normal    Step Around Obstacles Mild Impairment    Steps Mild Impairment    Total Score 20             HOME EXERCISE PROGRAM Last updated: 12/27/21 Access Code: [email or web address] URL: [email or web address] Date: 12/27/2021 Prepared by: [facility]  Exercises - Semi-Tandem Balance at Counter Top Eyes Open  - 1 x daily - 7 x weekly - 1-3 sets - 15 sec hold - Romberg Stance with Eyes Closed  - 1 x daily - 5 x weekly - 2 sets - 30 sec hold - Standing with Head Rotation  - 1 x daily - 5 x weekly - 2-3 sets - 30 sec hold - Side Step Overs with  Cones and Unilateral Counter Support  - 1 x daily - 5 x weekly - 2 sets - 10 reps - Mid-Lower Cervical Extension SNAG with Strap  - 1 x daily - 5 x weekly - 2 sets - 10 reps - Seated Assisted Cervical Rotation with Towel  - 1 x daily - 5 x weekly - 2 sets - 10 reps - Seated Upper Trapezius Stretch  - 1 x daily - 5 x weekly - 2 sets - 20 sec hold  PATIENT EDUCATION: Education details: HEP update and review- to be performed in corner and with chair in front; edu on progress towards goals Person educated: Patient Education method: Explanation, Demonstration, Tactile cues, Verbal cues, and Handouts Education comprehension: verbalized understanding and returned demonstration    Below measures were taken at time of initial evaluation unless otherwise specified:   DIAGNOSTIC FINDINGS: 10/18/21 brain MRI: No acute intracranial abnormality. Stable since June and largely normal for age noncontrast MRI appearance of the brain. Right anterior convexity scalp hematoma.  COGNITION: Overall cognitive status: Within functional limits for tasks assessed   SENSATION: WFL Reports occasional N/T in fingertips   POSTURE: No Significant postural limitations   Cervical ROM:    Active A/PROM (deg) eval  Flexion 25  Extension 28  Right lateral flexion 20  Left lateral flexion 30  Right rotation 50  Left rotation 54  (Blank rows = not tested)  *no pain reported   GAIT: Gait pattern: WFL decreased gait speed and slight imbalance  Assistive device utilized: None Level of assistance: Modified independence    PATIENT SURVEYS:  FOTO [phone]   VESTIBULAR ASSESSMENT   GENERAL OBSERVATION: patient is wearing transition progressive lenses     OCULOMOTOR EXAM:   Ocular Alignment: normal   Ocular ROM: No Limitations   Spontaneous Nystagmus: absent   Gaze-Induced Nystagmus: absent   Smooth Pursuits: saccades and 1 in each direction- normal for age   Saccades:  slightly slow and poor  trajectory d/t cognition       VESTIBULAR - OCULAR REFLEX:    Slow VOR: Comment: poor gaze stability to R; intact vertical   VOR Cancellation: Corrective Saccades, L>R   Head-Impulse Test: HIT Right: positive HIT Left: negative       POSITIONAL TESTING:  Right Roll Test: negative Left Roll Test: negative  Right Dix-Hallpike: negative, however pt could not tolerate proper positioning d/t neck discomfort Left Sidelying: L upbeating torsional nystagmus; Duration: 10 sec     VESTIBULAR TREATMENT:   PATIENT EDUCATION: Education details: prognosis, POC, edu on BPPV  and handout Person educated: Patient Education method: Explanation, Demonstration, Tactile cues, Verbal cues, and Handouts Education comprehension: verbalized understanding   GOALS: Goals reviewed with patient? Yes  SHORT TERM GOALS: Target date: 12/13/2021  Patient to be independent with initial HEP. Baseline: HEP initiated Goal status: MET 12/20/21    LONG TERM GOALS: Target date: 12/27/2021  Patient to be independent with advanced HEP. Baseline: Not yet initiated  Goal status: MET 12/27/21  Patient to report 0/10 dizziness with standing vertical and horizontal VOR for 30 seconds. Baseline: Unable Goal status: MET 12/20/21  Patient will report 0/10 dizziness with bed mobility.  Baseline: Symptomatic  Goal status: MET 12/20/21   Patient to demonstrate mild-moderate sway with M-CTSIB condition with eyes closed/foam surface in order to improve safety in environments with uneven surfaces and dim lighting. Baseline: severe sway on 12/05/21, mod-severe sway on 12/20/21, moderate sway 12/27/21 Goal status: IN PROGRESS 12/27/21  Patient to score at least 20/24 on DGI in order to decrease risk of falls. Baseline: 18/24 on 12/05/21, 18/24 on 12/20/21 Goal status: MET 12/27/21  Patient to score at least 65 on FOTO in order to indicate improved functional outcomes.  Baseline: 64, 59 12/27/21 Goal status: NOT  MET 12/27/21    ASSESSMENT:  CLINICAL IMPRESSION: Patient arrived to session with report of some neck pain today. Reports that he was able to sleep flat in his bed without dizziness for the past couple nights. M-CTSIB is grossly unchanged; still showing difficulty using vestibular system for balance, but improved from last assessment. Patient scored 20/24 on DGI today, indicating decreased risk of falls. Patient has met all goals relating to dizziness and made progress towards other goals. Updated HEP to address remaining balance deficits with edu on safe performance at home. Patient reported understanding of all edu provided today and without complaints at end of session. Ready for DC at this time.    OBJECTIVE IMPAIRMENTS Abnormal gait, decreased balance, decreased ROM, dizziness, impaired flexibility, and pain.   ACTIVITY LIMITATIONS carrying, lifting, bending, standing, stairs, transfers, bed mobility, bathing, toileting, dressing, and reach over head  PARTICIPATION LIMITATIONS: meal prep, cleaning, laundry, driving, shopping, community activity, occupation, and yard work  PERSONAL FACTORS  Age, Past/current experiences, Time since onset of injury/illness/exacerbation, and 3+ comorbidities: CKD, HLD, HTN, systolic heart failure   are also affecting patient's functional outcome.   REHAB POTENTIAL: Good  CLINICAL DECISION MAKING: Evolving/moderate complexity  EVALUATION COMPLEXITY: Moderate   PLAN: PT FREQUENCY: 1-2x/week  PT DURATION: 4 weeks  PLANNED INTERVENTIONS: Therapeutic exercises, Therapeutic activity, Neuromuscular re-education, Balance training, Gait training, Patient/Family education, Self Care, Joint mobilization, Stair training, Vestibular training, Canalith repositioning, Aquatic Therapy, Dry Needling, Cryotherapy, Moist heat, Taping, Manual therapy, and Re-evaluation  PLAN FOR NEXT SESSION:  DC at this time  PHYSICAL THERAPY DISCHARGE SUMMARY  Visits from Start of  Care: 6  Current functional level related to goals / functional outcomes: See above clinical impression   Remaining deficits: Imbalance on foam with EC   Education / Equipment: HEP  Plan: Patient agrees to discharge.  Patient goals were partially met. Patient is being discharged due to meeting the stated rehab goals.         Janene Harvey, PT, DPT 12/27/21 1:52 PM  [facility] [address] [place], [place], [place] [address] Phone # [phone] Fax # [phone]

## 2021-12-27 ENCOUNTER — Ambulatory Visit: Payer: Medicare Other | Attending: Internal Medicine | Admitting: Physical Therapy

## 2021-12-27 ENCOUNTER — Encounter: Payer: Self-pay | Admitting: Physical Therapy

## 2021-12-27 DIAGNOSIS — H8112 Benign paroxysmal vertigo, left ear: Secondary | ICD-10-CM | POA: Insufficient documentation

## 2021-12-27 DIAGNOSIS — R2681 Unsteadiness on feet: Secondary | ICD-10-CM | POA: Diagnosis present

## 2021-12-28 ENCOUNTER — Encounter: Payer: Self-pay | Admitting: Student

## 2021-12-28 ENCOUNTER — Ambulatory Visit: Payer: Medicare Other | Attending: Student | Admitting: Student

## 2021-12-28 VITALS — BP 136/76 | HR 70 | Ht 70.0 in | Wt 159.0 lb

## 2021-12-28 DIAGNOSIS — N183 Chronic kidney disease, stage 3 unspecified: Secondary | ICD-10-CM

## 2021-12-28 DIAGNOSIS — I350 Nonrheumatic aortic (valve) stenosis: Secondary | ICD-10-CM

## 2021-12-28 DIAGNOSIS — N189 Chronic kidney disease, unspecified: Secondary | ICD-10-CM | POA: Insufficient documentation

## 2021-12-28 DIAGNOSIS — E785 Hyperlipidemia, unspecified: Secondary | ICD-10-CM

## 2021-12-28 DIAGNOSIS — I10 Essential (primary) hypertension: Secondary | ICD-10-CM

## 2021-12-28 DIAGNOSIS — R55 Syncope and collapse: Secondary | ICD-10-CM | POA: Diagnosis not present

## 2021-12-28 NOTE — Patient Instructions (Signed)
Medication Instructions:  Your physician recommends that you continue on your current medications as directed. Please refer to the Current Medication list given to you today.  *If you need a refill on your cardiac medications before your next appointment, please call your pharmacy*   Follow-Up: At [facility], you and your health needs are our priority.  As part of our continuing mission to provide you with exceptional heart care, we have created designated Provider Care Teams.  These Care Teams include your primary Cardiologist (physician) and Advanced Practice Providers (APPs -  Physician Assistants and Nurse Practitioners) who all work together to provide you with the care you need, when you need it.  Your next appointment:   6 month(s)  The format for your next appointment:   In Person  Provider:   Fransico Him, MD

## 2022-04-04 ENCOUNTER — Other Ambulatory Visit (HOSPITAL_COMMUNITY): Payer: Self-pay

## 2022-04-05 ENCOUNTER — Ambulatory Visit (HOSPITAL_COMMUNITY)
Admission: RE | Admit: 2022-04-05 | Discharge: 2022-04-05 | Disposition: A | Discharge status: Home / Self Care | Payer: Medicare Other | Source: Ambulatory Visit | Attending: Internal Medicine | Admitting: Internal Medicine

## 2022-04-05 DIAGNOSIS — M810 Age-related osteoporosis without current pathological fracture: Secondary | ICD-10-CM | POA: Diagnosis present

## 2022-04-05 MED ORDER — DENOSUMAB 60 MG/ML ~~LOC~~ SOSY
60.0000 mg | PREFILLED_SYRINGE | Freq: Once | SUBCUTANEOUS | Status: AC
  Administered 2022-04-05: 60 mg via SUBCUTANEOUS

## 2022-04-05 MED ORDER — DENOSUMAB 60 MG/ML ~~LOC~~ SOSY
PREFILLED_SYRINGE | SUBCUTANEOUS | Status: AC
  Filled 2022-04-05: qty 1

## 2022-06-01 IMAGING — MR MR HEAD WO/W CM
12 series · 48 of 48 positions shown · IV contrast (gadavist)
Comparison: None Available.

CLINICAL DATA: Vertigo

EXAM:
MRI HEAD WITHOUT AND WITH CONTRAST
TECHNIQUE: Multiplanar, multiecho pulse sequences of the brain and surrounding
structures were obtained without and with intravenous contrast.
CONTRAST:  7.1mL GADAVIST GADOBUTROL 1 MMOL/ML IV SOLN

[Series 2: DWI · axial · 3.0mm · 1.46mm/px · z∈[-48,+113]mm · 6 of 110 slices shown (1 of 4)]
[im 1/110]
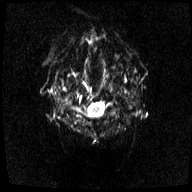
[im 22/110]
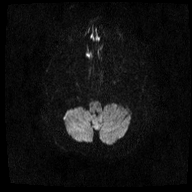
[im 44/110]
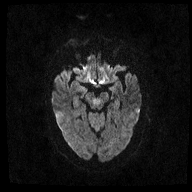
[im 66/110]
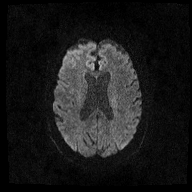
[im 88/110]
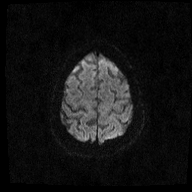
[im 110/110]
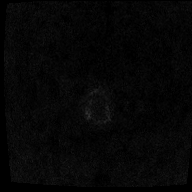

[Series 3: DWI · axial · 3.0mm · 1.46mm/px · z∈[-48,+113]mm · 3 of 55 slices shown (2 of 4)]
[im 1/55]
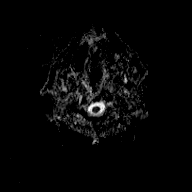
[im 28/55]
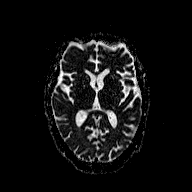
[im 55/55]
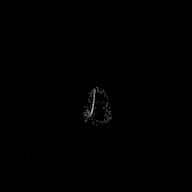

[Series 4: DWI · coronal · 5.0mm · 1.46mm/px · 4 of 70 slices shown (3 of 4)]
[im 1/70]
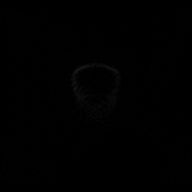
[im 24/70]
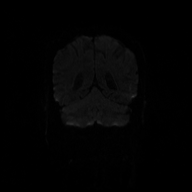
[im 47/70]
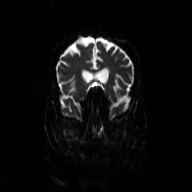
[im 70/70]
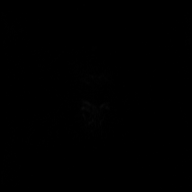

[Series 5: DWI · coronal · 5.0mm · 1.46mm/px · 2 of 35 slices shown (4 of 4)]
[im 1/35]
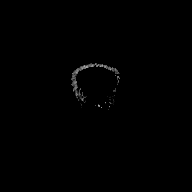
[im 35/35]
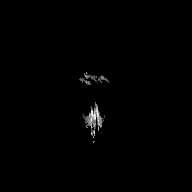

[Series 6: T1 · sagittal · 5.0mm · 0.47mm/px · 2 of 26 slices shown (1 of 2)]
[im 1/26]
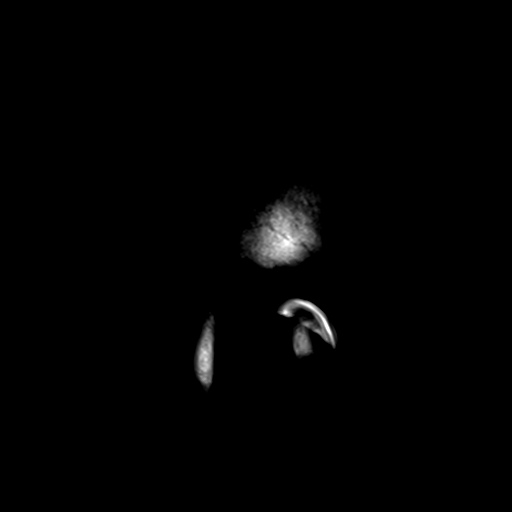
[im 26/26]
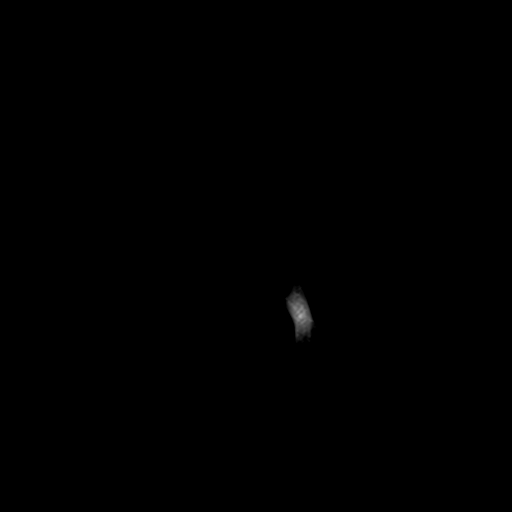

[Series 7: T2 · axial · 5.0mm · 0.75mm/px · z∈[-43,+124]mm · 2 of 25 slices shown (1 of 2)]
[im 1/25]
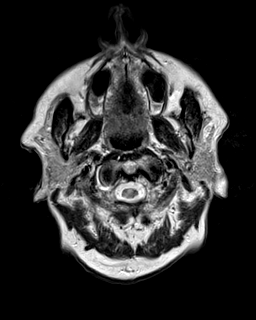
[im 25/25]
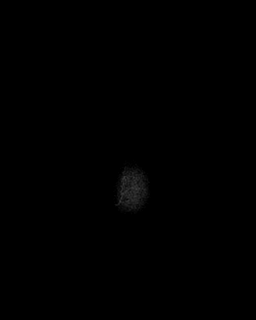

[Series 8: FLAIR · axial · 3.0mm · 0.47mm/px · z∈[-40,+120]mm · 3 of 55 slices shown]
[im 1/55]
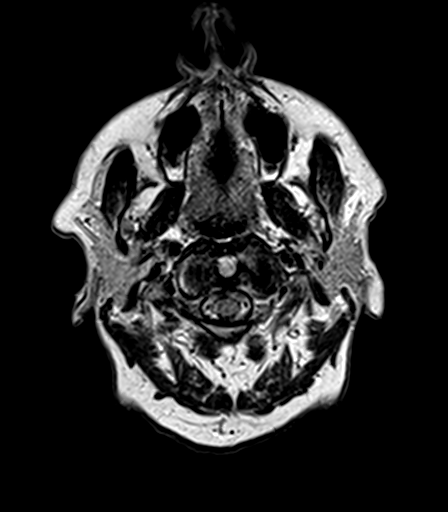
[im 28/55]
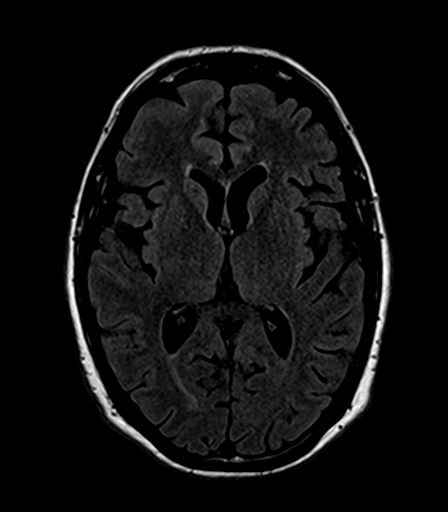
[im 55/55]
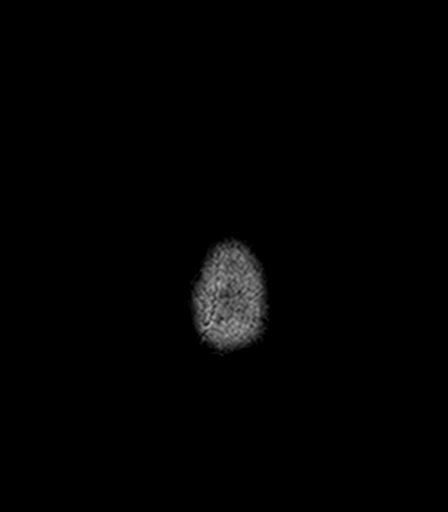

[Series 9: T2 · axial · 5.0mm · 0.75mm/px · z∈[-43,+123]mm · 2 of 25 slices shown (2 of 2)]
[im 1/25]
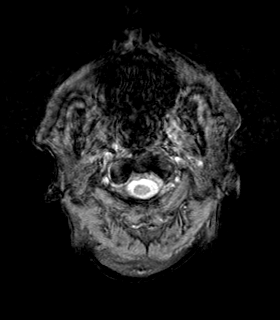
[im 25/25]
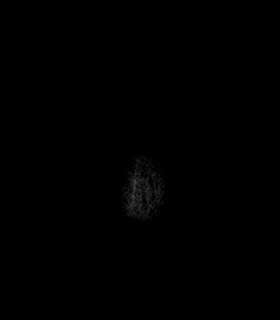

[Series 10: T1 · axial · 1.0mm · 0.94mm/px · z∈[-38,+119]mm · 10 of 160 slices shown (2 of 2)]
[im 1/160]
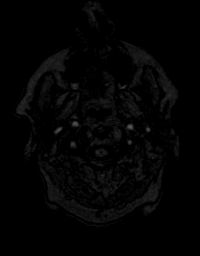
[im 18/160]
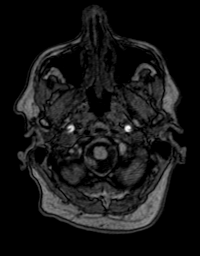
[im 36/160]
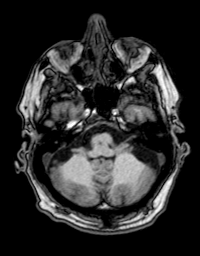
[im 54/160]
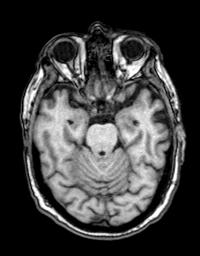
[im 71/160]
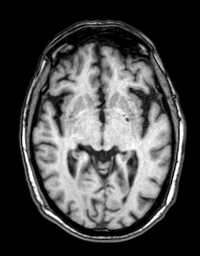
[im 89/160]
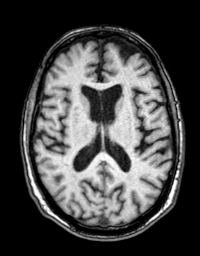
[im 107/160]
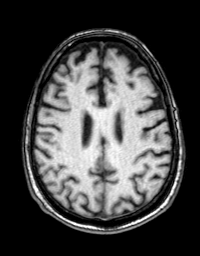
[im 124/160]
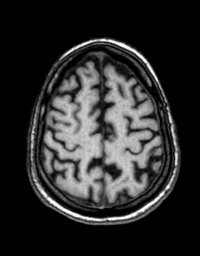
[im 142/160]
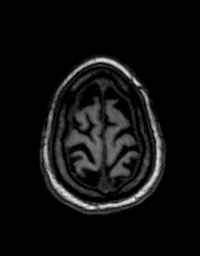
[im 160/160]
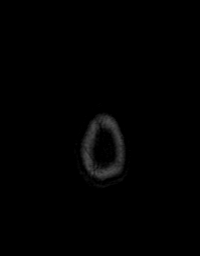

[Series 11: T2 post-contrast · coronal · 5.0mm · 0.43mm/px · 2 of 31 slices shown]
[im 1/31]
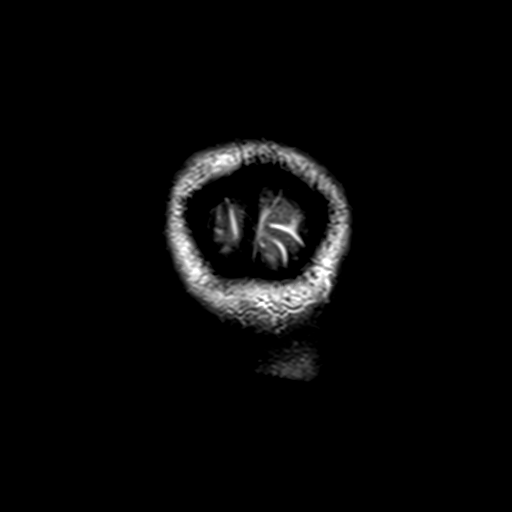
[im 31/31]
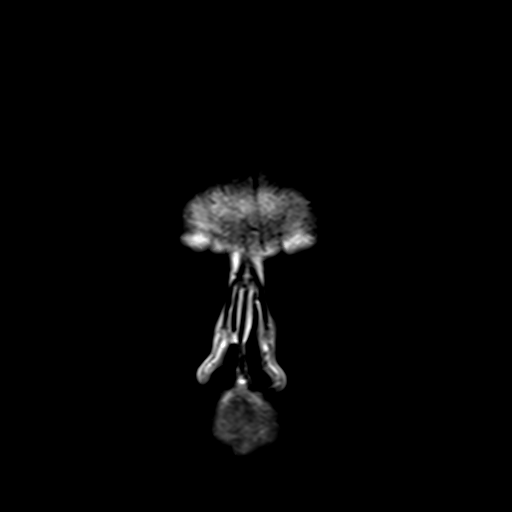

[Series 12: T1 post-contrast · axial · 1.0mm · 0.94mm/px · z∈[-38,+119]mm · 10 of 160 slices shown (1 of 2)]
[im 1/160]
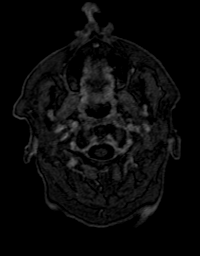
[im 18/160]
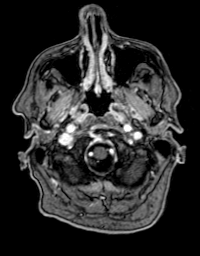
[im 36/160]
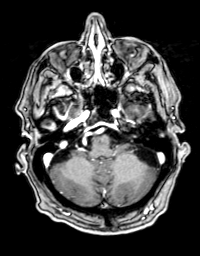
[im 54/160]
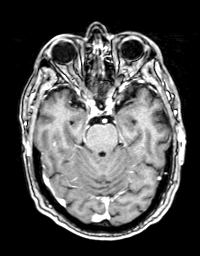
[im 71/160]
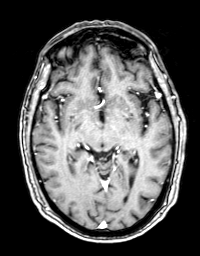
[im 89/160]
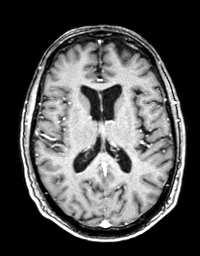
[im 107/160]
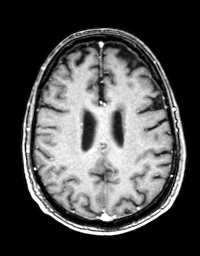
[im 124/160]
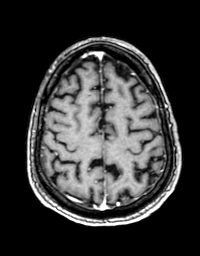
[im 142/160]
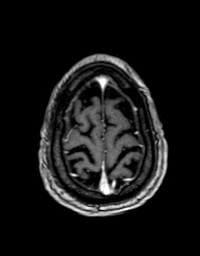
[im 160/160]
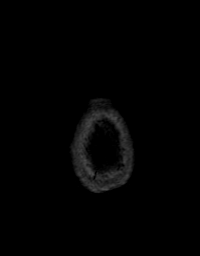

[Series 13: T1 post-contrast · coronal · 5.0mm · 0.43mm/px · 2 of 31 slices shown (2 of 2)]
[im 1/31]
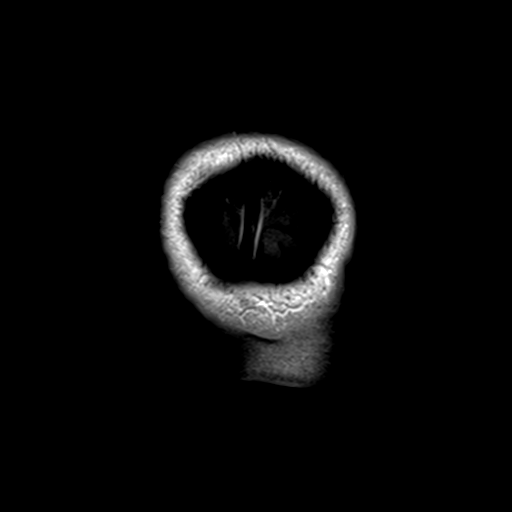
[im 31/31]
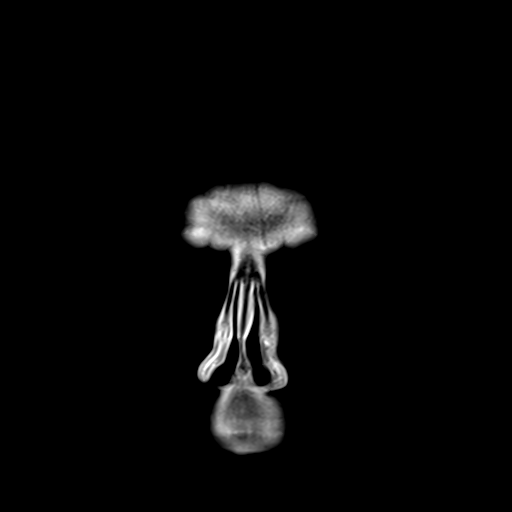

[48 of 48 positions shown; findings below may reference images not displayed]

FINDINGS: Brain: There is no acute infarction or intracranial hemorrhage.
There is no intracranial mass, mass effect, or edema. There is no
hydrocephalus or extra-axial fluid collection. Prominence of the
ventricles and sulci reflects minor parenchymal volume loss. Minimal
foci of T2 hyperintensity in the supratentorial white matter likely
reflect minor nonspecific gliosis/demyelination such as chronic
microvascular ischemic changes. No abnormal enhancement.

Vascular: Major vessel flow voids at the skull base are preserved.

Skull and upper cervical spine: Normal marrow signal is preserved.

Sinuses/Orbits: Paranasal sinuses are aerated. Orbits are
unremarkable.

Other: Sella is unremarkable.  Mastoid air cells are clear.
IMPRESSION: No evidence of recent infarction, hemorrhage, or mass. No abnormal
enhancement.

## 2022-09-29 ENCOUNTER — Other Ambulatory Visit (HOSPITAL_COMMUNITY): Payer: Self-pay | Admitting: *Deleted

## 2022-10-04 ENCOUNTER — Encounter (HOSPITAL_COMMUNITY)
Admission: RE | Admit: 2022-10-04 | Discharge: 2022-10-04 | Disposition: A | Discharge status: Home / Self Care | Payer: Medicare Other | Source: Ambulatory Visit | Attending: Internal Medicine | Admitting: Internal Medicine

## 2022-10-04 DIAGNOSIS — M810 Age-related osteoporosis without current pathological fracture: Secondary | ICD-10-CM | POA: Diagnosis present

## 2022-10-04 MED ORDER — DENOSUMAB 60 MG/ML ~~LOC~~ SOSY
60.0000 mg | PREFILLED_SYRINGE | Freq: Once | SUBCUTANEOUS | Status: AC
  Administered 2022-10-04: 60 mg via SUBCUTANEOUS

## 2022-10-04 MED ORDER — DENOSUMAB 60 MG/ML ~~LOC~~ SOSY
PREFILLED_SYRINGE | SUBCUTANEOUS | Status: AC
  Filled 2022-10-04: qty 1

## 2022-11-17 ENCOUNTER — Encounter: Payer: Self-pay | Admitting: Physician Assistant

## 2022-11-17 ENCOUNTER — Ambulatory Visit: Payer: Medicare Other | Attending: Physician Assistant | Admitting: Physician Assistant

## 2022-11-17 VITALS — BP 138/80 | HR 70 | Ht 70.0 in | Wt 170.4 lb

## 2022-11-17 DIAGNOSIS — I350 Nonrheumatic aortic (valve) stenosis: Secondary | ICD-10-CM

## 2022-11-17 DIAGNOSIS — N1832 Chronic kidney disease, stage 3b: Secondary | ICD-10-CM

## 2022-11-17 DIAGNOSIS — I10 Essential (primary) hypertension: Secondary | ICD-10-CM

## 2022-11-17 DIAGNOSIS — R55 Syncope and collapse: Secondary | ICD-10-CM | POA: Diagnosis not present

## 2022-11-17 DIAGNOSIS — N183 Chronic kidney disease, stage 3 unspecified: Secondary | ICD-10-CM

## 2022-11-17 NOTE — Patient Instructions (Signed)
Medication Instructions:  Your physician recommends that you continue on your current medications as directed. Please refer to the Current Medication list given to you today.  *If you need a refill on your cardiac medications before your next appointment, please call your pharmacy*   Lab Work: Complete lipids in October with your primary care provider If you have labs (blood work) drawn today and your tests are completely normal, you will receive your results only by: MyChart Message (if you have MyChart) OR A paper copy in the mail If you have any lab test that is abnormal or we need to change your treatment, we will call you to review the results.   Testing/Procedures: None ordered   Follow-Up: At [facility], you and your health needs are our priority.  As part of our continuing mission to provide you with exceptional heart care, we have created designated Provider Care Teams.  These Care Teams include your primary Cardiologist (physician) and Advanced Practice Providers (APPs -  Physician Assistants and Nurse Practitioners) who all work together to provide you with the care you need, when you need it.  We recommend signing up for the patient portal called "MyChart".  Sign up information is provided on this After Visit Summary.  MyChart is used to connect with patients for Virtual Visits (Telemedicine).  Patients are able to view lab/test results, encounter notes, upcoming appointments, etc.  Non-urgent messages can be sent to your provider as well.   To learn more about what you can do with MyChart, go to [email or web address].    Your next appointment:   12 month(s)  Provider:   Armanda Magic, MD     Other Instructions

## 2022-11-17 NOTE — Progress Notes (Signed)
Cardiology Office Note:  .   Date:  11/17/2022  ID:  Justin Shaw, DOB 02-01-1935, MRN [id number] PCP: Rodrigo Ran, MD  [facility] Providers Cardiologist:  Armanda Magic, MD {  History of Present Illness: .   Justin Shaw is a 88 y.o. male with a past medical history of syncope with no significant arrhythmias noted on recent monitor, moderate aortic stenosis, hypertension, hyperlipidemia, end-stage 3 CKD who is followed by Dr. Mayford Knife and presents for follow-up.  Patient was admitted 10/17/2021 through 10/20/2021 for neck pain after falling out of bed.  Patient reported that for 2 days prior to admission have been having severe episodes of dizziness which he described as vertigo and room spinning.  He was sleeping in a recliner because he could not lie flat as that would worsen the vertigo.  On the evening prior to admission, declined to sleep in his bed and went to lay but apparently fell out of bed.  Patient did not remember anything about this event and it was unclear whether he was unconscious or not.  Hit his head in the process and sustained a laceration requiring suturing.  He also hurt his neck.  Brain MRI showed a scalp hematoma but no acute intracranial findings.  MRI of the C-spine showed acute ligamentous injury.  Orthostatics were negative.  Echo showed LVEF 45 to 50% with global hypokinesis and grade 1 DD as well as mild aortic stenosis but study was poor quality.  Therefore, repeat limited echo was ordered the next day and showed LVEF 50% with Definity contrast and moderate aortic stenosis.  DVI of aortic valve was 0.22 which is more consistent with severe aortic stenosis.  LV stroke-volume index is low at 23 and valve area is 1.2 cm.  Dr. Mayford Knife personally reviewed images and felt this was more consistent with flow-limiting low gradient moderate aortic stenosis.  Home benazepril was stopped due to renal function and patient was started on metoprolol XL 25 mg daily.   Outpatient 30-day monitor was ordered to rule out arrhythmia in light of possible syncopal episode.  Monitor showed 5 beat run of NSVT, versus nonsustained atrial tachycardia with aberration but no significant arrhythmias to explain syncopal episode.  Was seen by Sharlene Dory, NP 11/02/2021 to follow-up.  At that time he was doing well since discharge and denied any dizziness or recurrent syncope.  Carotid Dopplers were ordered and while these were being done 11/05/2027 he had witnessed syncopal episode.  He reportedly got dizzy and then briefly passed out.  He was added onto Dr. Bufford Buttner scheduled that same day and EKG showed normal sinus rhythm.  Still wearing his 30-day event monitor.  Monitor ultimately showed a 5 beat run of NSVT versus nonsustained atrial tachycardia with aberration but no significant arrhythmias to explain syncopal episode.  Syncope was felt to be due to an inner ear issue and not a cardiac problem.  Patient was last seen 10//23 and at that time was doing much better.  He had been doing PT who "move the crystals in his ear" and he has not had any recurrent dizziness or syncopal episodes since then.  No cardiac complaints.  No chest pain, shortness of breath, orthopnea, PND, palpitations, lower extremity edema.  Heart rate and blood pressure well-controlled.  Today, he tells me he is doing excellent from a cardiovascular standpoint.  He brings in a blood pressure log and all values are well-controlled over the last week or so.  Heart rate is  70 today.  Tolerating medications.  He is only on 2 prescription drug.  He has been doing exercise classes a pending burn where he and his wife reside.  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  Reviewed recent lab work and all numbers are at goal.  Refills provided today.  Reports no shortness of breath nor dyspnea on exertion. Reports no chest pain, pressure, or tightness. No edema, orthopnea,  PND. Reports no palpitations.    ROS: Pertinent ROS in HPI  Studies Reviewed: .       Echo 10/19/21  IMPRESSIONS     1. Left ventricular ejection fraction, by estimation, is 50%. Left  ventricular ejection fraction by 2D MOD biplane is 50.4 %. The left  ventricle has low normal function.   FINDINGS   Left Ventricle: Left ventricular ejection fraction, by estimation, is  50%. Left ventricular ejection fraction by 2D MOD biplane is 50.4 %. The  left ventricle has low normal function. Definity contrast agent was given  IV to delineate the left ventricular   endocardial borders.   30-day event monitor 12/05/2021   Predominat rhythm was normal sinus rhythm with average heart rate 73bpm and ranged from 49-138bpm   Wide complex tachycardia consistent with Nonsustained ventricular tachycardiavs. nonsustained atrial tachycardia with aberration up to 5 beats.     Physical Exam:   VS:  BP 138/80   Pulse 70   Ht 5\' 10"  (1.778 m)   Wt 170 lb 6.4 oz (77.3 kg)   SpO2 96%   BMI 24.45 kg/m    Wt Readings from Last 3 Encounters:  11/17/22 170 lb 6.4 oz (77.3 kg)  12/28/21 159 lb (72.1 kg)  11/04/21 154 lb (69.9 kg)    GEN: Well nourished, well developed in no acute distress NECK: No JVD; No carotid bruits CARDIAC: RRR, soft systolic murmur, rubs, gallops RESPIRATORY:  Clear to auscultation without rales, wheezing or rhonchi  ABDOMEN: Soft, non-tender, non-distended EXTREMITIES:  No edema; No deformity   ASSESSMENT AND PLAN: .   1.  Syncope thought to be noncardiac in nature -no further episodes -working with PT on inner ear exercises -Continue current medication regimen which include metoprolol succinate 37.5 mg daily, pitavastatin 2 mg daily  2.  Aortic valve stenosis -mild with moderate calcification, mild aortic stenosis -no need to repeat at this time -Quiet murmur on exam  3.  Hypertension -well controlled today, brought in a log and reviewed  -Would recommend  continuing logging continuing current medications -Continue low-sodium, heart healthy diet -Continue exercise as able  4.  Hyperlipidemia -due for a lipid panel update in Oct, he plans to do this with his PCP -Recent labs from last year showed LDL 86, HDL 54, total cholesterol 981, and triglycerides 109 -Continue current medications  5.  Stage III CKD   -update labs per PCP   Dispo: He can follow-up in 1 year with Dr. Mayford Knife  Signed, Sharlene Dory, PA-C

## 2023-02-04 ENCOUNTER — Other Ambulatory Visit: Payer: Self-pay | Admitting: Medical Genetics

## 2023-02-04 DIAGNOSIS — Z006 Encounter for examination for normal comparison and control in clinical research program: Secondary | ICD-10-CM

## 2023-03-05 ENCOUNTER — Other Ambulatory Visit (HOSPITAL_COMMUNITY)
Admission: RE | Admit: 2023-03-05 | Discharge: 2023-03-05 | Disposition: A | Discharge status: Home / Self Care | Payer: Self-pay | Source: Ambulatory Visit | Attending: Oncology | Admitting: Oncology

## 2023-03-05 DIAGNOSIS — Z006 Encounter for examination for normal comparison and control in clinical research program: Secondary | ICD-10-CM | POA: Insufficient documentation

## 2023-03-19 LAB — GENECONNECT MOLECULAR SCREEN: Genetic Analysis Overall Interpretation: NEGATIVE

## 2023-05-08 ENCOUNTER — Other Ambulatory Visit (HOSPITAL_COMMUNITY): Payer: Self-pay | Admitting: *Deleted

## 2023-05-09 ENCOUNTER — Encounter (HOSPITAL_COMMUNITY): Payer: Medicare Other

## 2023-05-14 ENCOUNTER — Other Ambulatory Visit (HOSPITAL_COMMUNITY): Payer: Self-pay | Admitting: *Deleted

## 2023-05-15 ENCOUNTER — Ambulatory Visit (HOSPITAL_COMMUNITY)
Admission: RE | Admit: 2023-05-15 | Discharge: 2023-05-15 | Disposition: A | Discharge status: Home / Self Care | Payer: Medicare Other | Source: Ambulatory Visit | Attending: Internal Medicine | Admitting: Internal Medicine

## 2023-05-15 DIAGNOSIS — M810 Age-related osteoporosis without current pathological fracture: Secondary | ICD-10-CM | POA: Diagnosis present

## 2023-05-15 MED ORDER — DENOSUMAB 60 MG/ML ~~LOC~~ SOSY
PREFILLED_SYRINGE | SUBCUTANEOUS | Status: AC
  Filled 2023-05-15: qty 1

## 2023-05-15 MED ORDER — DENOSUMAB 60 MG/ML ~~LOC~~ SOSY
60.0000 mg | PREFILLED_SYRINGE | Freq: Once | SUBCUTANEOUS | Status: AC
  Administered 2023-05-15: 60 mg via SUBCUTANEOUS

## 2023-08-02 ENCOUNTER — Emergency Department (HOSPITAL_BASED_OUTPATIENT_CLINIC_OR_DEPARTMENT_OTHER)
Admission: EM | Admit: 2023-08-02 | Discharge: 2023-08-02 | Disposition: A | Discharge status: Home / Self Care | Attending: Emergency Medicine | Admitting: Emergency Medicine

## 2023-08-02 ENCOUNTER — Other Ambulatory Visit: Payer: Self-pay

## 2023-08-02 ENCOUNTER — Encounter (HOSPITAL_BASED_OUTPATIENT_CLINIC_OR_DEPARTMENT_OTHER): Payer: Self-pay | Admitting: *Deleted

## 2023-08-02 ENCOUNTER — Emergency Department (HOSPITAL_BASED_OUTPATIENT_CLINIC_OR_DEPARTMENT_OTHER)

## 2023-08-02 DIAGNOSIS — M79672 Pain in left foot: Secondary | ICD-10-CM | POA: Diagnosis present

## 2023-08-02 DIAGNOSIS — N183 Chronic kidney disease, stage 3 unspecified: Secondary | ICD-10-CM | POA: Insufficient documentation

## 2023-08-02 DIAGNOSIS — I129 Hypertensive chronic kidney disease with stage 1 through stage 4 chronic kidney disease, or unspecified chronic kidney disease: Secondary | ICD-10-CM | POA: Insufficient documentation

## 2023-08-02 DIAGNOSIS — Z79899 Other long term (current) drug therapy: Secondary | ICD-10-CM | POA: Diagnosis not present

## 2023-08-02 NOTE — ED Provider Notes (Signed)
 [place] [facility] AT [facility] Provider Note   CSN: [id number] Arrival date & time: 08/02/23  1309     History  Chief Complaint  Patient presents with   Foot Pain    Justin Shaw is a 89 y.o. male.  Justin Shaw is an 89 M with a past medical history of HTN, HLD, CKD III who presents for left foot pain. He is accompanied by his wife. Patient states his left foot has been hurting for the past 2-3 months, seems to be getting worse over time. Last night the pain in his left foot (dorsal medial surface) was so bad he could not sleep. He was seen by a podiatrist in the past and told he may have tendonitis and/or plantar fasciitis. He has been treating the pain with ice, rest, and Tylenol  with no relief. He does wear shoe gel inserts. Denies any previous injury or surgeries to the left foot. Follows with Dr. Lucienne Ryder (orthopedics).    Foot Pain       Home Medications Prior to Admission medications   Medication Sig Start Date End Date Taking? Authorizing Provider  b complex vitamins capsule Take 1 capsule by mouth daily.    [provider]  Cholecalciferol  (VITAMIN D3) 50 MCG (2000 UT) capsule Take 1 capsule by mouth daily.     [provider]  Coenzyme Q10 200 MG capsule Take 200 mg by mouth daily.    [provider]  fluticasone  (FLONASE ) 50 MCG/ACT nasal spray Place 1 spray into both nostrils daily.    [provider]  LIVALO 2 MG TABS Take 1 tablet by mouth daily.  12/11/15   [provider]  metoprolol  succinate (TOPROL  XL) 25 MG 24 hr tablet Take 25 mg 1&1/2 tablets daily 11/04/21   Lasalle Pointer, NP  mupirocin ointment (BACTROBAN) 2 % Apply topically as needed. 10/11/21   [provider]      Allergies    Crestor [rosuvastatin calcium], Hydrocodone, Hydrocodone-acetaminophen , and Oxycodone-acetaminophen     Review of Systems   Review of Systems  Physical Exam Updated Vital Signs BP (!)  170/96 (BP Location: Left Arm)   Pulse 83   Temp 98.1 F (36.7 C) (Oral)   Resp 20   SpO2 100%  Physical Exam  ED Results / Procedures / Treatments   Labs (all labs ordered are listed, but only abnormal results are displayed) Labs Reviewed - No data to display  EKG None  Radiology DG Foot Complete Left Result Date: 08/02/2023 CLINICAL DATA:  left foot pain EXAM: LEFT FOOT - COMPLETE 3+ VIEW COMPARISON:  None Available. FINDINGS: Osteopenia. No acute fracture or dislocation. Undersurface calcaneal heel spur. Small Achilles insertion enthesophyte. Minimal degenerative changes of the midfoot. Soft tissues are unremarkable. No radiopaque foreign body. IMPRESSION: No acute fracture or dislocation. Electronically Signed   By: Rance Burrows M.D.   On: 08/02/2023 14:23    Procedures Procedures    Medications Ordered in ED Medications - No data to display  ED Course/ Medical Decision Making/ A&P Clinical Course as of 08/02/23 1701  Thu Aug 02, 2023  1633 He is here with a complaint of left foot top of foot pain has been going on for 3 weeks.  He said he had been walking up to 4 miles a day prior to this.  No known trauma though.  Saw podiatry who told him it was probably tendinitis alert sounded like a very cursory exam without x-rays.  He is  scheduled to see orthopedics on Monday.  Nothing definite on exam some tenderness midfoot good distal pulses motor and sensation.  X-rays do not show any significant findings.  Consider topical NSAID and cam boot [MB]    Clinical Course User Index [MB] Tonya Fredrickson, MD                                 Medical Decision Making Left foot exam fairly unremarkable. There was some tenderness to the medial dorsal surface of the left foot with deep palpation, otherwise normal appearance, ROM, pulses, and sensation. Left foot xray was negative for acute fracture, dislocation, pr foreign body and positive for calcaneal heel spur, insertion enthesophyte  and degenerative changes of the midfoot. Discussed findings and recommended CAM boot to keep foot flat as well as OTC voltaren gel for additional pain relief. Oral NSAIDs contraindicated given patient's CKD III. Patient agreeable to plan, will continue to use Tylenol  as needed, apply ice, and rest. Has follow up appointment on Monday with Dr. Rosario Commons for further evaluation and recommendations. At this time patient stable and agreeable with discharge home. All concerns and questions were addressed.   Amount and/or Complexity of Data Reviewed Radiology: ordered.         Final Clinical Impression(s) / ED Diagnoses Final diagnoses:  Foot pain, left    Rx / DC Orders ED Discharge Orders     None         Arellano Zameza, Shritha Bresee, MD 08/02/23 1701    Tonya Fredrickson, MD 08/03/23 1025

## 2023-08-02 NOTE — ED Triage Notes (Signed)
 Pt with hx of tendonitis is here for pain in left foot.  Pt denies any trauma and states that he has seen a foot doctor for this in the past and was dx with tendonitis.  Pt has a lidocaine  patch in place and has been using ice and tylenol  without relief.  Ambulatory with a limp to triage.

## 2023-08-02 NOTE — Discharge Instructions (Signed)
 You were seen for left foot pain. Your xray was reassuring that there is no fracture, foreign body, or dislocation. It did show evidence of heel spurs, which can lead to symptoms you are experiencing. At this time we recommend the use of a CAM boot (see handout) and voltaren gel (which you can get over the counter, see handout) and apply it as needed to the affected area. This is only minimally absorbed, but should be used with caution given your history of chronic kidney disease. You can also continue to use the shoe gel insert, ice, rest, and Tylenol  as needed. We recommend that you follow up with Justin Shaw for further evaluation.   Should you experience fever, pain that does not improve and prevents you from being able to walk or stand, or pain behind the calf accompanied by shortness of breath, please seek medical assistance as you may be experiencing a medical emergency.

## 2023-08-06 ENCOUNTER — Ambulatory Visit: Admitting: Physician Assistant

## 2023-10-03 ENCOUNTER — Telehealth (HOSPITAL_COMMUNITY): Payer: Self-pay

## 2023-10-03 NOTE — Telephone Encounter (Signed)
 Auth Submission: APPROVED Site of care: Site of care: [facility] Payer: [facility] Medicare Medication & CPT/J Code(s) submitted: Prolia  (Denosumab ) [id number] Diagnosis Code: M81.0 Route of submission (phone, fax, portal): Portal Phone # Fax # Auth type: Buy/Bill HB Units/visits requested: 60mg  x 2 doses Reference number: [id number] Approval from: 05/04/23 to 05/03/23

## 2023-11-12 ENCOUNTER — Other Ambulatory Visit (HOSPITAL_COMMUNITY): Payer: Self-pay

## 2023-11-13 ENCOUNTER — Ambulatory Visit (HOSPITAL_COMMUNITY)
Admission: RE | Admit: 2023-11-13 | Discharge: 2023-11-13 | Disposition: A | Discharge status: Home / Self Care | Source: Ambulatory Visit | Attending: Internal Medicine | Admitting: Internal Medicine

## 2023-11-13 DIAGNOSIS — M810 Age-related osteoporosis without current pathological fracture: Secondary | ICD-10-CM | POA: Insufficient documentation

## 2023-11-13 MED ORDER — DENOSUMAB 60 MG/ML ~~LOC~~ SOSY
60.0000 mg | PREFILLED_SYRINGE | Freq: Once | SUBCUTANEOUS | Status: AC
  Administered 2023-11-13: 60 mg via SUBCUTANEOUS

## 2023-11-13 MED ORDER — DENOSUMAB 60 MG/ML ~~LOC~~ SOSY
PREFILLED_SYRINGE | SUBCUTANEOUS | Status: AC
Start: 2023-11-13 — End: 2023-11-13
  Filled 2023-11-13: qty 1

## 2024-01-28 ENCOUNTER — Encounter: Payer: Self-pay | Admitting: Radiology
